# Patient Record
Sex: Female | Born: 1935 | Race: White | Hispanic: No | Smoking: Never smoker
Health system: Southern US, Community
[De-identification: ages and names within clinical notes are randomized; demographics above are authoritative.]

## PROBLEM LIST (undated history)

## (undated) DIAGNOSIS — I420 Dilated cardiomyopathy: Secondary | ICD-10-CM

## (undated) DIAGNOSIS — F0390 Unspecified dementia without behavioral disturbance: Secondary | ICD-10-CM

## (undated) DIAGNOSIS — I639 Cerebral infarction, unspecified: Secondary | ICD-10-CM

## (undated) DIAGNOSIS — M1990 Unspecified osteoarthritis, unspecified site: Secondary | ICD-10-CM

## (undated) DIAGNOSIS — I5022 Chronic systolic (congestive) heart failure: Principal | ICD-10-CM

## (undated) HISTORY — DX: Chronic systolic (congestive) heart failure: I50.22

## (undated) HISTORY — DX: Dilated cardiomyopathy: I42.0

---

## 1998-12-12 ENCOUNTER — Other Ambulatory Visit: Admission: RE | Admit: 1998-12-12 | Discharge: 1998-12-12 | Payer: Self-pay | Admitting: Obstetrics & Gynecology

## 2000-12-10 ENCOUNTER — Encounter (INDEPENDENT_AMBULATORY_CARE_PROVIDER_SITE_OTHER): Payer: Self-pay

## 2000-12-10 ENCOUNTER — Other Ambulatory Visit: Admission: RE | Admit: 2000-12-10 | Discharge: 2000-12-10 | Payer: Self-pay | Admitting: Obstetrics and Gynecology

## 2003-06-28 ENCOUNTER — Encounter: Admission: RE | Admit: 2003-06-28 | Discharge: 2003-06-28 | Payer: Self-pay | Admitting: Family Medicine

## 2003-08-22 ENCOUNTER — Emergency Department (HOSPITAL_COMMUNITY): Admission: EM | Admit: 2003-08-22 | Discharge: 2003-08-22 | Payer: Self-pay | Admitting: Emergency Medicine

## 2006-10-06 ENCOUNTER — Encounter (INDEPENDENT_AMBULATORY_CARE_PROVIDER_SITE_OTHER): Payer: Self-pay | Admitting: Specialist

## 2006-10-06 ENCOUNTER — Ambulatory Visit (HOSPITAL_COMMUNITY): Admission: RE | Admit: 2006-10-06 | Discharge: 2006-10-06 | Payer: Self-pay | Admitting: Gastroenterology

## 2008-03-19 ENCOUNTER — Emergency Department (HOSPITAL_COMMUNITY): Admission: EM | Admit: 2008-03-19 | Discharge: 2008-03-19 | Payer: Self-pay | Admitting: Emergency Medicine

## 2008-09-16 ENCOUNTER — Emergency Department (HOSPITAL_COMMUNITY): Admission: EM | Admit: 2008-09-16 | Discharge: 2008-09-17 | Payer: Self-pay | Admitting: Emergency Medicine

## 2010-08-30 ENCOUNTER — Encounter: Payer: Self-pay | Admitting: Family Medicine

## 2010-12-26 NOTE — Op Note (Signed)
NAME:  Christine [name], Christine [name] NO.:  [id number]   MEDICAL RECORD NO.:  [id number]          PATIENT TYPE:  AMB   LOCATION:  ENDO                         FACILITY:  [facility]   PHYSICIAN:  Anselmo Rod, M.D.  DATE OF BIRTH:  July 16, 1922   DATE OF PROCEDURE:  10/06/2006  DATE OF DISCHARGE:                               OPERATIVE REPORT   PROCEDURE PERFORMED:  Colonoscopy with snare polypectomy x1 and cold  biopsies times one.   ENDOSCOPIST:  Anselmo Rod, M.D.   INSTRUMENT USED:  Pentax video colonoscope.   INDICATIONS FOR PROCEDURE:  An 77 year old white female undergoing  screening colonoscopy.  The patient has a history of constipation and  use of laxatives on regular basis.  Rule out colonic polyps, masses,  etc.   PREPROCEDURE PREPARATION:  Informed consent was procured from the  patient.  The patient fasted for 8 hours prior to procedure and prepped  with a gallon of Nulytely the night prior to the procedure.  The risks  and benefits of the procedure including a 10% miss rate of cancer and  polyp were discussed with the patient as well.   PREPROCEDURE PHYSICAL:  The patient had stable vital signs.  Neck  supple.  Chest clear to auscultation.  S1, S2 regular.  Abdomen soft  with normal bowel sounds.   DESCRIPTION OF PROCEDURE:  The patient was placed in the left lateral  decubitus position and sedated with 75 mcg of fentanyl and 7 mg of  Versed given intravenously in slow incremental doses.  Once the patient  was adequately sedated and maintained on low-flow oxygen and continuous  cardiac monitoring, the Pentax video colonoscope was advanced from the  rectum to cecum with difficulty.  The patient has a large amount of  residual stool in the colon.  Multiple washes were done.  The  appendiceal orifice and ileocecal valve were visualized and  photographed.  A small sessile polyp was removed by hot snare from the  cecum (one) at lower settings of 150/15).   Another small sessile polyp  was biopsied from the cecum (cold biopsies x1).  There were a few  scattered diverticula noted especially in the sigmoid colon.  Small  internal hemorrhoids were seen on retroflexion.  The rest of the exam  was unremarkable.  Small lesions could be missed.  The patient tolerated  the procedure well without immediate complications.  There was evidence  of melanosis coli throughout the colonic mucosa.   IMPRESSION:  1. Small nonbleeding internal hemorrhoids.  2. 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  3. Few scattered diverticula, especially in the left colon.  4. Flat polyp snared from the cecum with another polyp biopsied from      the cecum.  5. Large amount of residual stool in the colon.  Small lesions could      be missed.   RECOMMENDATIONS:  1. Await pathology results.  2. Avoid all nonsteroidals for aspirin for the next two weeks.  3. Repeat colonoscopy depending on pathology results.  4. Outpatient follow-up in the next two weeks for further  recommendations.      Anselmo Rod, M.D.  Electronically Signed     JNM/MEDQ  D:  10/06/2006  T:  10/06/2006  Job:  [id number]   cc:   Gloriajean Dell. Andrey Campanile, M.D.

## 2011-02-25 ENCOUNTER — Inpatient Hospital Stay (HOSPITAL_COMMUNITY)
Admission: EM | Admit: 2011-02-25 | Discharge: 2011-02-27 | DRG: 690 | Disposition: A | Discharge status: Home / Self Care | Payer: Medicare Other | Attending: Family Medicine | Admitting: Family Medicine

## 2011-02-25 ENCOUNTER — Emergency Department (HOSPITAL_COMMUNITY): Payer: Medicare Other

## 2011-02-25 DIAGNOSIS — A498 Other bacterial infections of unspecified site: Secondary | ICD-10-CM | POA: Diagnosis present

## 2011-02-25 DIAGNOSIS — R5381 Other malaise: Secondary | ICD-10-CM | POA: Diagnosis present

## 2011-02-25 DIAGNOSIS — E860 Dehydration: Secondary | ICD-10-CM | POA: Diagnosis present

## 2011-02-25 DIAGNOSIS — I10 Essential (primary) hypertension: Secondary | ICD-10-CM | POA: Diagnosis present

## 2011-02-25 DIAGNOSIS — R509 Fever, unspecified: Secondary | ICD-10-CM | POA: Diagnosis present

## 2011-02-25 DIAGNOSIS — E119 Type 2 diabetes mellitus without complications: Secondary | ICD-10-CM | POA: Diagnosis present

## 2011-02-25 DIAGNOSIS — N390 Urinary tract infection, site not specified: Principal | ICD-10-CM | POA: Diagnosis present

## 2011-02-25 LAB — DIFFERENTIAL
Band Neutrophils: 3 % (ref 0–10)
Basophils Absolute: 0 10*3/uL (ref 0.0–0.1)
Basophils Relative: 0 % (ref 0–1)
Blasts: 0 %
Eosinophils Absolute: 0 10*3/uL (ref 0.0–0.7)
Eosinophils Relative: 0 % (ref 0–5)
Lymphocytes Relative: 6 % — ABNORMAL LOW (ref 12–46)
Lymphs Abs: 0.8 10*3/uL (ref 0.7–4.0)
Metamyelocytes Relative: 3 %
Monocytes Absolute: 0.8 10*3/uL (ref 0.1–1.0)
Monocytes Relative: 6 % (ref 3–12)
Myelocytes: 0 %
Neutro Abs: 12.4 10*3/uL — ABNORMAL HIGH (ref 1.7–7.7)
Neutrophils Relative %: 82 % — ABNORMAL HIGH (ref 43–77)
Promyelocytes Absolute: 0 %
nRBC: 0 /100 WBC

## 2011-02-25 LAB — CBC
HCT: 38.7 % (ref 36.0–46.0)
Hemoglobin: 12.9 g/dL (ref 12.0–15.0)
MCH: 29.9 pg (ref 26.0–34.0)
MCHC: 33.3 g/dL (ref 30.0–36.0)
MCV: 89.6 fL (ref 78.0–100.0)
Platelets: 317 10*3/uL (ref 150–400)
RBC: 4.32 MIL/uL (ref 3.87–5.11)
RDW: 13.5 % (ref 11.5–15.5)
WBC: 14 10*3/uL — ABNORMAL HIGH (ref 4.0–10.5)

## 2011-02-25 LAB — COMPREHENSIVE METABOLIC PANEL
ALT: 14 U/L (ref 0–35)
AST: 20 U/L (ref 0–37)
Albumin: 3.3 g/dL — ABNORMAL LOW (ref 3.5–5.2)
Alkaline Phosphatase: 72 U/L (ref 39–117)
BUN: 18 mg/dL (ref 6–23)
CO2: 29 mEq/L (ref 19–32)
Calcium: 9.2 mg/dL (ref 8.4–10.5)
Chloride: 98 mEq/L (ref 96–112)
Creatinine, Ser: 0.99 mg/dL (ref 0.50–1.10)
GFR calc Af Amer: >60 mL/min (ref >60)
GFR calc non Af Amer: 53 mL/min — ABNORMAL LOW (ref >60)
Glucose, Bld: 300 mg/dL — ABNORMAL HIGH (ref 70–99)
Potassium: 4.2 mEq/L (ref 3.5–5.1)
Sodium: 136 mEq/L (ref 135–145)
Total Bilirubin: 0.8 mg/dL (ref 0.3–1.2)
Total Protein: 7.2 g/dL (ref 6.0–8.3)

## 2011-02-25 LAB — URINALYSIS, ROUTINE W REFLEX MICROSCOPIC
Bilirubin Urine: NEGATIVE
Glucose, UA: >1000 mg/dL — AB
Ketones, ur: NEGATIVE mg/dL
Nitrite: POSITIVE — AB
Protein, ur: 30 mg/dL — AB
Specific Gravity, Urine: 1.013 (ref 1.005–1.030)
Urobilinogen, UA: 0.2 mg/dL (ref 0.0–1.0)
pH: 7 (ref 5.0–8.0)

## 2011-02-25 LAB — POCT I-STAT, CHEM 8
BUN: 18 mg/dL (ref 6–23)
Calcium, Ion: 1.09 mmol/L — ABNORMAL LOW (ref 1.12–1.32)
Chloride: 100 mEq/L (ref 96–112)
Creatinine, Ser: 1.1 mg/dL (ref 0.50–1.10)
Glucose, Bld: 304 mg/dL — ABNORMAL HIGH (ref 70–99)
HCT: 40 % (ref 36.0–46.0)
Hemoglobin: 13.6 g/dL (ref 12.0–15.0)
Potassium: 4.4 mEq/L (ref 3.5–5.1)
Sodium: 137 mEq/L (ref 135–145)
TCO2: 26 mmol/L (ref 0–100)

## 2011-02-25 LAB — URINE MICROSCOPIC-ADD ON

## 2011-02-25 LAB — GLUCOSE, CAPILLARY: Glucose-Capillary: 203 mg/dL — ABNORMAL HIGH (ref 70–99)

## 2011-02-25 LAB — LIPASE, BLOOD: Lipase: 12 U/L (ref 11–59)

## 2011-02-25 LAB — APTT: aPTT: 33 seconds (ref 24–37)

## 2011-02-25 LAB — PROTIME-INR
INR: 1.22 (ref 0.00–1.49)
Prothrombin Time: 15.7 seconds — ABNORMAL HIGH (ref 11.6–15.2)

## 2011-02-25 LAB — TROPONIN I: Troponin I: <0.3 ng/mL (ref <0.30)

## 2011-02-26 LAB — CBC
HCT: 36 % (ref 36.0–46.0)
Hemoglobin: 12.1 g/dL (ref 12.0–15.0)
MCH: 30.2 pg (ref 26.0–34.0)
MCHC: 33.6 g/dL (ref 30.0–36.0)
MCV: 89.8 fL (ref 78.0–100.0)
Platelets: 279 10*3/uL (ref 150–400)
RBC: 4.01 MIL/uL (ref 3.87–5.11)
RDW: 13.6 % (ref 11.5–15.5)
WBC: 12.8 10*3/uL — ABNORMAL HIGH (ref 4.0–10.5)

## 2011-02-26 LAB — DIFFERENTIAL
Basophils Absolute: 0 10*3/uL (ref 0.0–0.1)
Basophils Relative: 0 % (ref 0–1)
Eosinophils Absolute: 0 10*3/uL (ref 0.0–0.7)
Eosinophils Relative: 0 % (ref 0–5)
Lymphocytes Relative: 12 % (ref 12–46)
Lymphs Abs: 1.6 10*3/uL (ref 0.7–4.0)
Monocytes Absolute: 1.3 10*3/uL — ABNORMAL HIGH (ref 0.1–1.0)
Monocytes Relative: 11 % (ref 3–12)
Neutro Abs: 9.8 10*3/uL — ABNORMAL HIGH (ref 1.7–7.7)
Neutrophils Relative %: 77 % (ref 43–77)

## 2011-02-26 LAB — GLUCOSE, CAPILLARY: Glucose-Capillary: 217 mg/dL — ABNORMAL HIGH (ref 70–99)

## 2011-02-26 LAB — HEMOGLOBIN A1C
Hgb A1c MFr Bld: 8.6 % — ABNORMAL HIGH (ref <5.7)
Mean Plasma Glucose: 200 mg/dL — ABNORMAL HIGH (ref <117)

## 2011-02-27 LAB — URINE CULTURE
Colony Count: >=100000
Culture  Setup Time: 201207190151
Special Requests: POSITIVE

## 2011-02-27 LAB — BASIC METABOLIC PANEL
BUN: 16 mg/dL (ref 6–23)
Creatinine, Ser: 1.15 mg/dL — ABNORMAL HIGH (ref 0.50–1.10)
GFR calc non Af Amer: 45 mL/min — ABNORMAL LOW (ref >60)
Glucose, Bld: 198 mg/dL — ABNORMAL HIGH (ref 70–99)
Potassium: 3.6 mEq/L (ref 3.5–5.1)

## 2011-02-27 LAB — CBC
HCT: 36.8 % (ref 36.0–46.0)
Hemoglobin: 12.6 g/dL (ref 12.0–15.0)
MCH: 30.4 pg (ref 26.0–34.0)
MCHC: 34.2 g/dL (ref 30.0–36.0)

## 2011-02-27 LAB — GLUCOSE, CAPILLARY
Glucose-Capillary: 233 mg/dL — ABNORMAL HIGH (ref 70–99)
Glucose-Capillary: 165 mg/dL — ABNORMAL HIGH (ref 70–99)

## 2011-03-04 LAB — CULTURE, BLOOD (ROUTINE X 2): Culture  Setup Time: 201207190950

## 2011-03-05 NOTE — Discharge Summary (Signed)
NAMEMarland Kitchen  Christine [name], Christine [name] NO.:  [id number]  MEDICAL RECORD NO.:  [id number]  LOCATION:  1344                         FACILITY:  [facility]  PHYSICIAN:  Erick Blinks, MD     DATE OF BIRTH:  [birth year]  DATE OF ADMISSION:  02/25/2011 DATE OF DISCHARGE:  02/27/2011                              DISCHARGE SUMMARY   PRIMARY CARE PHYSICIAN:  Gloriajean Dell. Andrey Campanile, M.D.  DISCHARGE DIAGNOSES: 1. Urinary tract infection, E-coli. 2. Fever secondary to #1. 3. Hypertension. 4. Type 2 diabetes. 5. Dehydration, resolved.  DISCHARGE MEDICATIONS: 1. Centrum Silver multivitamin 1 tablet p.o. daily. 2. Vitamin B12 over-the-counter 1 capsule p.o. daily. 3. Zinc over-the-counter 1 tablet p.o. daily. 4. Vitamin D3 over-the-counter 1 tablet p.o. daily. 5. Augmentin 875 mg p.o. b.i.d. 6. Metoprolol 25 mg 1 tablet p.o. b.i.d. 7. Amaryl 1 mg 1 tablet p.o. daily.  ADMISSION HISTORY:  This is an 77 year old female who presents to the emergency room with weakness for 2 to 3 days.  The patient reported having hot and cold chills.  She had nausea and persistent emesis.  In the emergency room, she was found have leukocytosis, was febrile and had a positive urinalysis.  She was subsequently admitted for further treatment of her urinary tract infection.  For details, please refer the history and physical per Dr. Tobey Grim COURSE: 1. Urinary tract infection.  The patient was placed empirically on     Rocephin.  She also did receive a dose of IV ciprofloxacin from     which she developed hives.  Therefore she was continued on     Rocephin.  Since then she has begun to defervesce.  Her WBC count     has returned to a normal range.  She has been adequately rehydrated     and feels back to her normal self.  She is ambulating without     difficulty.  At this time, we will switch her to oral antibiotics.     Her urine cultures are positive for E-coli.  We will provide her     empiric coverage  with Augmentin.  She will need to follow up with     her primary care physician in next 1 week. 2. Hypertension.  The patient was noted to be persistently     hypertensive here in the hospital.  Her blood pressure has been     ranging between 150 to 170.  We will start empirically on     metoprolol and this can further be followed up by her primary care     physician. 3. Diabetes.  The patient was found to have an A1c of 8.6.  Her blood     sugar on initial evaluation in the emergency room was 300.  This     has since become stable.  She was placed on sliding scale insulin     here in the emergency room.  We will discharge her on low-dose     Amaryl as well as diet precautions.  She may follow up with her     primary care physician for further management.  DISCHARGE INSTRUCTIONS:  The patient should follow up with her  primary care physician in the next 1 week which she said she can arrange.  She will need to continue on a heart-healthy, low-calorie diet; conduct her activity as tolerated.  CONDITION AT TIME OF DISCHARGE:  Improved.     Erick Blinks, MD     JM/MEDQ  D:  02/27/2011  T:  02/27/2011  Job:  [id number]  cc:   Gloriajean Dell. Andrey Campanile, M.D. Fax: [phone]  Electronically Signed by Erick Blinks  on 03/05/2011 01:07:04 AM

## 2011-03-11 NOTE — H&P (Signed)
NAME:  Christine [name], Christine [name] NO.:  [id number]  MEDICAL RECORD NO.:  [id number]  LOCATION:  1344                         FACILITY:  [facility]  PHYSICIAN:  Altha Harm, MDDATE OF BIRTH:  1922-04-10  DATE OF ADMISSION:  02/25/2011 DATE OF DISCHARGE:                             HISTORY & PHYSICAL   PRIMARY CARE PHYSICIAN:  Benedetto Goad, MD  CHIEF COMPLAINT:  Weakness x 2 to 3 days.  HISTORY OF PRESENT ILLNESS:  Christine [name] is an 77 year old female, who looks so much younger than her stated age, who presents to the emergency room with weakness for 2 to 3 days.  The patient states that on today she had some hot flashes.  She had hot and cold chills, nausea and persistent emesis.  The patient says she is also having some frequency of urination over the last few days.  She denies any dizziness, any chest pain, any polyuria, any polydipsia or any polyphagia.  The patient states that the only thing she could tolerate were fruit cups.  So she had two fruit cups last night and two fruit cups this morning prior to coming to the emergency room.  PAST MEDICAL HISTORY:  The patient denies any past medical history, however, from previous records it shows a history of Melanosis coli from a colonoscopy performed by Dr. Loreta Ave in 2005 and a history of diverticulosis.  FAMILY HISTORY:  Significant for cancer in her father; the patient does not know what type of cancer.  And, CVAs in her mother.  SOCIAL HISTORY:  The patient lives alone but has good support of her family, her son and her granddaughter who is a Health and safety inspector.  She has no tobacco, alcohol or drug use, and the patient is independent at the community level.  ALLERGIES:  IBUPROFEN and ALEVE.  And the patient thinks some antibiotic that was given to for shingles, likely either VALTREX or ACYCLOVIR, which caused hives.  Presently she is on no chronic medications.  She is only on multiple vitamins at home.  REVIEW OF  SYSTEMS:  All other systems negative.  STUDIES IN THE EMERGENCY ROOM:  White blood cell count of 14, hemoglobin of 13.6, hematocrit of 40, platelet count of 317.  Sodium 136, potassium 4.2, chloride 98, bicarb 29, BUN 18, creatinine 0.99, blood sugar of 300.  PHYSICAL EXAMINATION:  VITAL SIGNS:  The patient is presently afebrile, vital signs are stable. GENERAL:  The patient is well-appearing, sitting up in bed, who looks much younger than her stated age. HEENT:  She is normocephalic, atraumatic.  Pupils equally round and reactive to light and accommodation.  Extraocular movements are intact. Oropharynx is moist.  No exudates, erythema or lesions are noted. NECK:  Trachea is midline.  No masses, no thyromegaly, no JVD, no carotid bruit. LUNGS:  Normal respiratory effort, equal excursion bilaterally.  No wheezing or rhonchi noted. CARDIOVASCULAR:  Normal S1 and S2.  No murmurs, rubs or gallops are noted.  PMI is nondisplaced.  No heaves or thrills on palpation. ABDOMEN:  Obese, soft, nontender, nondistended.  No masses.  No hepatosplenomegaly is noted. EXTREMITIES:  No clubbing, cyanosis or edema.  The patient has no CVA tenderness. NEUROLOGIC:  No focal neurological deficits.  Cranial nerves II-XII are grossly intact.  DTRs are 2+ bilaterally in the upper and lower extremities. PSYCHIATRIC:  She is alert and oriented x3.  Good insight and cognition. Good recent and remote recall.  ASSESSMENT AND PLAN:  This is a patient who presents with a urinary tract infection by findings in the urinalysis.  The patient will be given gentle IV fluids and we will also start her on IV antibiotics, ciprofloxacin.  Hyperglycemia:  The patient has eaten since then very high in sugar immediately prior to coming into the emergency room.  I will go ahead and get a hemoglobin A1c and check her blood sugars.  However, I will not start any insulin on this patient as it is highly unlikely that  the patient of 88 years will develop adult-onset diabetes at this age when she has had no history of diabetes ever in the past.  Otherwise, the patient is stable.  She is being admitted to team 3 and she will be placed on a telemetry bed.     Altha Harm, MD     MAM/MEDQ  D:  02/25/2011  T:  02/25/2011  Job:  [id number]  Electronically Signed by Marthann Schiller MD on 03/11/2011 03:31:31 PM

## 2011-05-04 ENCOUNTER — Other Ambulatory Visit (HOSPITAL_COMMUNITY): Payer: Self-pay | Admitting: Orthopedic Surgery

## 2011-05-04 DIAGNOSIS — T84039A Mechanical loosening of unspecified internal prosthetic joint, initial encounter: Secondary | ICD-10-CM

## 2011-05-13 ENCOUNTER — Other Ambulatory Visit (HOSPITAL_COMMUNITY): Payer: Medicare Other

## 2011-05-13 ENCOUNTER — Encounter (HOSPITAL_COMMUNITY): Payer: Medicare Other

## 2014-10-12 ENCOUNTER — Ambulatory Visit: Payer: Self-pay | Admitting: Podiatry

## 2016-03-11 ENCOUNTER — Other Ambulatory Visit: Payer: Self-pay | Admitting: Geriatric Medicine

## 2016-03-11 ENCOUNTER — Ambulatory Visit
Admission: RE | Admit: 2016-03-11 | Discharge: 2016-03-11 | Disposition: A | Discharge status: Home / Self Care | Payer: Medicare Other | Source: Ambulatory Visit | Attending: Geriatric Medicine | Admitting: Geriatric Medicine

## 2016-03-11 DIAGNOSIS — M25551 Pain in right hip: Secondary | ICD-10-CM

## 2016-03-28 ENCOUNTER — Inpatient Hospital Stay (HOSPITAL_COMMUNITY)
Admission: EM | Admit: 2016-03-28 | Discharge: 2016-03-30 | DRG: 470 | Disposition: A | Discharge status: Skilled Nursing Facility | Payer: Medicare Other | Attending: Internal Medicine | Admitting: Internal Medicine

## 2016-03-28 ENCOUNTER — Inpatient Hospital Stay (HOSPITAL_COMMUNITY): Payer: Medicare Other | Admitting: Anesthesiology

## 2016-03-28 ENCOUNTER — Inpatient Hospital Stay (HOSPITAL_COMMUNITY): Payer: Medicare Other

## 2016-03-28 ENCOUNTER — Emergency Department (HOSPITAL_COMMUNITY): Payer: Medicare Other

## 2016-03-28 ENCOUNTER — Encounter (HOSPITAL_COMMUNITY): Payer: Self-pay

## 2016-03-28 ENCOUNTER — Encounter (HOSPITAL_COMMUNITY)
Admission: EM | Disposition: A | Discharge status: Skilled Nursing Facility | Payer: Self-pay | Source: Home / Self Care | Attending: Internal Medicine

## 2016-03-28 DIAGNOSIS — E119 Type 2 diabetes mellitus without complications: Secondary | ICD-10-CM

## 2016-03-28 DIAGNOSIS — I10 Essential (primary) hypertension: Secondary | ICD-10-CM | POA: Diagnosis present

## 2016-03-28 DIAGNOSIS — I159 Secondary hypertension, unspecified: Secondary | ICD-10-CM | POA: Diagnosis present

## 2016-03-28 DIAGNOSIS — Z1611 Resistance to penicillins: Secondary | ICD-10-CM | POA: Diagnosis present

## 2016-03-28 DIAGNOSIS — D473 Essential (hemorrhagic) thrombocythemia: Secondary | ICD-10-CM

## 2016-03-28 DIAGNOSIS — E871 Hypo-osmolality and hyponatremia: Secondary | ICD-10-CM | POA: Diagnosis present

## 2016-03-28 DIAGNOSIS — Z8781 Personal history of (healed) traumatic fracture: Secondary | ICD-10-CM | POA: Diagnosis not present

## 2016-03-28 DIAGNOSIS — Z01818 Encounter for other preprocedural examination: Secondary | ICD-10-CM

## 2016-03-28 DIAGNOSIS — Y9210 Unspecified residential institution as the place of occurrence of the external cause: Secondary | ICD-10-CM | POA: Diagnosis not present

## 2016-03-28 DIAGNOSIS — D72829 Elevated white blood cell count, unspecified: Secondary | ICD-10-CM | POA: Diagnosis present

## 2016-03-28 DIAGNOSIS — N390 Urinary tract infection, site not specified: Secondary | ICD-10-CM | POA: Diagnosis present

## 2016-03-28 DIAGNOSIS — S72002A Fracture of unspecified part of neck of left femur, initial encounter for closed fracture: Principal | ICD-10-CM | POA: Diagnosis present

## 2016-03-28 DIAGNOSIS — E860 Dehydration: Secondary | ICD-10-CM | POA: Diagnosis present

## 2016-03-28 DIAGNOSIS — M1612 Unilateral primary osteoarthritis, left hip: Secondary | ICD-10-CM | POA: Diagnosis present

## 2016-03-28 DIAGNOSIS — W010XXA Fall on same level from slipping, tripping and stumbling without subsequent striking against object, initial encounter: Secondary | ICD-10-CM | POA: Diagnosis present

## 2016-03-28 DIAGNOSIS — E878 Other disorders of electrolyte and fluid balance, not elsewhere classified: Secondary | ICD-10-CM | POA: Diagnosis present

## 2016-03-28 DIAGNOSIS — B961 Klebsiella pneumoniae [K. pneumoniae] as the cause of diseases classified elsewhere: Secondary | ICD-10-CM | POA: Diagnosis present

## 2016-03-28 DIAGNOSIS — E1100 Type 2 diabetes mellitus with hyperosmolarity without nonketotic hyperglycemic-hyperosmolar coma (NKHHC): Secondary | ICD-10-CM

## 2016-03-28 DIAGNOSIS — D7589 Other specified diseases of blood and blood-forming organs: Secondary | ICD-10-CM | POA: Diagnosis present

## 2016-03-28 DIAGNOSIS — S72009A Fracture of unspecified part of neck of unspecified femur, initial encounter for closed fracture: Secondary | ICD-10-CM | POA: Diagnosis present

## 2016-03-28 DIAGNOSIS — M810 Age-related osteoporosis without current pathological fracture: Secondary | ICD-10-CM | POA: Diagnosis present

## 2016-03-28 DIAGNOSIS — M25552 Pain in left hip: Secondary | ICD-10-CM | POA: Diagnosis present

## 2016-03-28 DIAGNOSIS — D75839 Thrombocytosis, unspecified: Secondary | ICD-10-CM | POA: Diagnosis present

## 2016-03-28 DIAGNOSIS — N3000 Acute cystitis without hematuria: Secondary | ICD-10-CM | POA: Diagnosis not present

## 2016-03-28 DIAGNOSIS — E1165 Type 2 diabetes mellitus with hyperglycemia: Secondary | ICD-10-CM | POA: Diagnosis present

## 2016-03-28 DIAGNOSIS — Z419 Encounter for procedure for purposes other than remedying health state, unspecified: Secondary | ICD-10-CM

## 2016-03-28 HISTORY — DX: Unspecified osteoarthritis, unspecified site: M19.90

## 2016-03-28 HISTORY — PX: ANTERIOR APPROACH HEMI HIP ARTHROPLASTY: SHX6690

## 2016-03-28 LAB — CBC WITH DIFFERENTIAL/PLATELET
Basophils Absolute: 0 10*3/uL (ref 0.0–0.1)
Basophils Relative: 0 %
EOS ABS: 0 10*3/uL (ref 0.0–0.7)
EOS PCT: 0 %
HCT: 42.8 % (ref 36.0–46.0)
Hemoglobin: 15.3 g/dL — ABNORMAL HIGH (ref 12.0–15.0)
LYMPHS ABS: 1.4 10*3/uL (ref 0.7–4.0)
Lymphocytes Relative: 6 %
MCH: 30.1 pg (ref 26.0–34.0)
MCHC: 35.7 g/dL (ref 30.0–36.0)
MCV: 84.3 fL (ref 78.0–100.0)
MONO ABS: 1.2 10*3/uL — AB (ref 0.1–1.0)
MONOS PCT: 5 %
Neutro Abs: 20.4 10*3/uL — ABNORMAL HIGH (ref 1.7–7.7)
Neutrophils Relative %: 89 %
PLATELETS: 538 10*3/uL — AB (ref 150–400)
RBC: 5.08 MIL/uL (ref 3.87–5.11)
RDW: 13 % (ref 11.5–15.5)
WBC: 23.1 10*3/uL — AB (ref 4.0–10.5)

## 2016-03-28 LAB — TYPE AND SCREEN
ABO/RH(D): A POS
Antibody Screen: NEGATIVE

## 2016-03-28 LAB — TROPONIN I: Troponin I: <0.03 ng/mL (ref <0.03)

## 2016-03-28 LAB — PROTIME-INR
INR: 1.01
Prothrombin Time: 13.3 seconds (ref 11.4–15.2)

## 2016-03-28 LAB — GLUCOSE, CAPILLARY
Glucose-Capillary: 341 mg/dL — ABNORMAL HIGH (ref 65–99)
Glucose-Capillary: 276 mg/dL — ABNORMAL HIGH (ref 65–99)
Glucose-Capillary: 260 mg/dL — ABNORMAL HIGH (ref 65–99)
Glucose-Capillary: 190 mg/dL — ABNORMAL HIGH (ref 65–99)
Glucose-Capillary: 217 mg/dL — ABNORMAL HIGH (ref 65–99)
Glucose-Capillary: 275 mg/dL — ABNORMAL HIGH (ref 65–99)

## 2016-03-28 LAB — URINALYSIS, ROUTINE W REFLEX MICROSCOPIC
Bilirubin Urine: NEGATIVE
KETONES UR: NEGATIVE mg/dL
Nitrite: NEGATIVE
PH: 5.5 (ref 5.0–8.0)
Protein, ur: 30 mg/dL — AB
SPECIFIC GRAVITY, URINE: 1.024 (ref 1.005–1.030)

## 2016-03-28 LAB — SURGICAL PCR SCREEN
MRSA, PCR: NEGATIVE
STAPHYLOCOCCUS AUREUS: NEGATIVE

## 2016-03-28 LAB — BASIC METABOLIC PANEL
Anion gap: 10 (ref 5–15)
BUN: 30 mg/dL — AB (ref 6–20)
CO2: 24 mmol/L (ref 22–32)
CREATININE: 0.84 mg/dL (ref 0.44–1.00)
Calcium: 9.4 mg/dL (ref 8.9–10.3)
Chloride: 100 mmol/L — ABNORMAL LOW (ref 101–111)
GFR calc Af Amer: >60 mL/min (ref >60)
GFR, EST NON AFRICAN AMERICAN: 59 mL/min — AB (ref >60)
GLUCOSE: 364 mg/dL — AB (ref 65–99)
Potassium: 3.8 mmol/L (ref 3.5–5.1)
SODIUM: 134 mmol/L — AB (ref 135–145)

## 2016-03-28 LAB — URINE MICROSCOPIC-ADD ON
RBC / HPF: NONE SEEN RBC/hpf (ref 0–5)
Squamous Epithelial / LPF: NONE SEEN

## 2016-03-28 LAB — ABO/RH: ABO/RH(D): A POS

## 2016-03-28 SURGERY — HEMIARTHROPLASTY, HIP, DIRECT ANTERIOR APPROACH, FOR FRACTURE
Anesthesia: Monitor Anesthesia Care | Site: Hip | Laterality: Left

## 2016-03-28 MED ORDER — HYDROCODONE-ACETAMINOPHEN 5-325 MG PO TABS: 1.0000 | ORAL_TABLET | Freq: Four times a day (QID) | ORAL | 0 refills | Status: DC | PRN

## 2016-03-28 MED ORDER — PHENYLEPHRINE HCL 10 MG/ML IJ SOLN
INTRAMUSCULAR | Status: AC
  Filled 2016-03-28: qty 1

## 2016-03-28 MED ORDER — LACTATED RINGERS IV SOLN
INTRAVENOUS | Status: DC | PRN
  Administered 2016-03-28: 14:00:00 via INTRAVENOUS

## 2016-03-28 MED ORDER — FENTANYL CITRATE (PF) 100 MCG/2ML IJ SOLN
50.0000 ug | Freq: Once | INTRAMUSCULAR | Status: DC
Start: 2016-03-28 — End: 2016-03-28

## 2016-03-28 MED ORDER — SODIUM CHLORIDE 0.9 % IV SOLN
INTRAVENOUS | Status: DC
  Administered 2016-03-28 – 2016-03-29 (×2): via INTRAVENOUS

## 2016-03-28 MED ORDER — BUPIVACAINE IN DEXTROSE 0.75-8.25 % IT SOLN
INTRATHECAL | Status: DC | PRN
  Administered 2016-03-28: 1.8 mL via INTRATHECAL

## 2016-03-28 MED ORDER — BISACODYL 5 MG PO TBEC: 5.0000 mg | DELAYED_RELEASE_TABLET | Freq: Every day | ORAL | Status: DC | PRN

## 2016-03-28 MED ORDER — CHLORHEXIDINE GLUCONATE 4 % EX LIQD: 60.0000 mL | Freq: Once | CUTANEOUS | Status: DC

## 2016-03-28 MED ORDER — BUPIVACAINE HCL (PF) 0.25 % IJ SOLN
INTRAMUSCULAR | Status: AC
Start: 2016-03-28 — End: 2016-03-28
  Filled 2016-03-28: qty 30

## 2016-03-28 MED ORDER — POVIDONE-IODINE 10 % EX SWAB: 2.0000 "application " | Freq: Once | CUTANEOUS | Status: DC

## 2016-03-28 MED ORDER — SENNA 8.6 MG PO TABS
1.0000 | ORAL_TABLET | Freq: Two times a day (BID) | ORAL | Status: DC
  Administered 2016-03-28 – 2016-03-30 (×4): 8.6 mg via ORAL
  Filled 2016-03-28 (×4): qty 1

## 2016-03-28 MED ORDER — STERILE WATER FOR IRRIGATION IR SOLN
Status: DC | PRN
  Administered 2016-03-28: 2000 mL

## 2016-03-28 MED ORDER — CEFAZOLIN SODIUM-DEXTROSE 2-4 GM/100ML-% IV SOLN
2.0000 g | Freq: Four times a day (QID) | INTRAVENOUS | Status: AC
  Administered 2016-03-28 – 2016-03-29 (×2): 2 g via INTRAVENOUS
  Filled 2016-03-28 (×2): qty 100

## 2016-03-28 MED ORDER — OXYCODONE HCL 5 MG/5ML PO SOLN
5.0000 mg | Freq: Once | ORAL | Status: DC | PRN
  Filled 2016-03-28: qty 5

## 2016-03-28 MED ORDER — FENTANYL CITRATE (PF) 100 MCG/2ML IJ SOLN
INTRAMUSCULAR | Status: DC | PRN
  Administered 2016-03-28: 25 ug via INTRAVENOUS

## 2016-03-28 MED ORDER — DEXTROSE 5 % IV SOLN
1.0000 g | Freq: Once | INTRAVENOUS | Status: AC
  Administered 2016-03-28: 1 g via INTRAVENOUS
  Filled 2016-03-28: qty 10

## 2016-03-28 MED ORDER — POLYETHYLENE GLYCOL 3350 17 G PO PACK: 17.0000 g | PACK | Freq: Every day | ORAL | Status: DC | PRN

## 2016-03-28 MED ORDER — DOCUSATE SODIUM 100 MG PO CAPS
100.0000 mg | ORAL_CAPSULE | Freq: Two times a day (BID) | ORAL | Status: DC
  Administered 2016-03-28 – 2016-03-30 (×4): 100 mg via ORAL
  Filled 2016-03-28 (×4): qty 1

## 2016-03-28 MED ORDER — METHOCARBAMOL 1000 MG/10ML IJ SOLN
500.0000 mg | Freq: Four times a day (QID) | INTRAVENOUS | Status: DC | PRN
  Filled 2016-03-28: qty 5

## 2016-03-28 MED ORDER — METHOCARBAMOL 500 MG PO TABS
500.0000 mg | ORAL_TABLET | Freq: Four times a day (QID) | ORAL | Status: DC | PRN
  Administered 2016-03-29: 500 mg via ORAL
  Filled 2016-03-28: qty 1

## 2016-03-28 MED ORDER — PROPOFOL 10 MG/ML IV BOLUS
INTRAVENOUS | Status: DC | PRN
  Administered 2016-03-28: 30 mg via INTRAVENOUS

## 2016-03-28 MED ORDER — KETAMINE HCL 10 MG/ML IJ SOLN
INTRAMUSCULAR | Status: AC
  Filled 2016-03-28: qty 1

## 2016-03-28 MED ORDER — HYDRALAZINE HCL 20 MG/ML IJ SOLN
10.0000 mg | INTRAMUSCULAR | Status: DC | PRN
  Administered 2016-03-28: 10 mg via INTRAVENOUS
  Filled 2016-03-28: qty 1

## 2016-03-28 MED ORDER — HYDROMORPHONE HCL 1 MG/ML IJ SOLN
0.5000 mg | INTRAMUSCULAR | Status: DC | PRN
  Administered 2016-03-28 (×2): 0.5 mg via INTRAVENOUS
  Filled 2016-03-28 (×2): qty 1

## 2016-03-28 MED ORDER — ACETAMINOPHEN 650 MG RE SUPP: 650.0000 mg | Freq: Four times a day (QID) | RECTAL | Status: DC | PRN

## 2016-03-28 MED ORDER — INSULIN ASPART 100 UNIT/ML ~~LOC~~ SOLN
0.0000 [IU] | SUBCUTANEOUS | Status: DC
  Administered 2016-03-28: 7 [IU] via SUBCUTANEOUS
  Administered 2016-03-28: 5 [IU] via SUBCUTANEOUS

## 2016-03-28 MED ORDER — CEFAZOLIN SODIUM-DEXTROSE 2-3 GM-% IV SOLR
INTRAVENOUS | Status: DC | PRN
  Administered 2016-03-28: 2 g via INTRAVENOUS

## 2016-03-28 MED ORDER — ACETAMINOPHEN 325 MG PO TABS: 650.0000 mg | ORAL_TABLET | Freq: Four times a day (QID) | ORAL | Status: DC | PRN

## 2016-03-28 MED ORDER — SODIUM CHLORIDE 0.9 % IV SOLN
INTRAVENOUS | Status: DC
  Administered 2016-03-28: 06:00:00 via INTRAVENOUS

## 2016-03-28 MED ORDER — ASPIRIN EC 325 MG PO TBEC: 325.0000 mg | DELAYED_RELEASE_TABLET | Freq: Two times a day (BID) | ORAL | 0 refills | Status: DC

## 2016-03-28 MED ORDER — FENTANYL CITRATE (PF) 100 MCG/2ML IJ SOLN: 25.0000 ug | INTRAMUSCULAR | Status: DC | PRN

## 2016-03-28 MED ORDER — FENTANYL CITRATE (PF) 100 MCG/2ML IJ SOLN
INTRAMUSCULAR | Status: AC
  Filled 2016-03-28: qty 2

## 2016-03-28 MED ORDER — ONDANSETRON HCL 4 MG/2ML IJ SOLN: 4.0000 mg | Freq: Four times a day (QID) | INTRAMUSCULAR | Status: DC | PRN

## 2016-03-28 MED ORDER — HYDROCODONE-ACETAMINOPHEN 5-325 MG PO TABS
1.0000 | ORAL_TABLET | Freq: Four times a day (QID) | ORAL | Status: DC | PRN
  Administered 2016-03-28 – 2016-03-30 (×3): 1 via ORAL
  Filled 2016-03-28 (×3): qty 1

## 2016-03-28 MED ORDER — ASPIRIN EC 325 MG PO TBEC
325.0000 mg | DELAYED_RELEASE_TABLET | Freq: Two times a day (BID) | ORAL | Status: DC
  Administered 2016-03-29 – 2016-03-30 (×3): 325 mg via ORAL
  Filled 2016-03-28 (×3): qty 1

## 2016-03-28 MED ORDER — KETAMINE HCL 50 MG/ML IJ SOLN
INTRAMUSCULAR | Status: DC | PRN
  Administered 2016-03-28: 25 mg via INTRAMUSCULAR

## 2016-03-28 MED ORDER — CEFAZOLIN SODIUM-DEXTROSE 2-4 GM/100ML-% IV SOLN: 2.0000 g | INTRAVENOUS | Status: DC

## 2016-03-28 MED ORDER — ONDANSETRON HCL 4 MG/2ML IJ SOLN
INTRAMUSCULAR | Status: AC
  Filled 2016-03-28: qty 2

## 2016-03-28 MED ORDER — PROPOFOL 500 MG/50ML IV EMUL
INTRAVENOUS | Status: DC | PRN
  Administered 2016-03-28: 10 ug/kg/min via INTRAVENOUS

## 2016-03-28 MED ORDER — FENTANYL CITRATE (PF) 100 MCG/2ML IJ SOLN
25.0000 ug | INTRAMUSCULAR | Status: DC | PRN
  Filled 2016-03-28: qty 2

## 2016-03-28 MED ORDER — CEFAZOLIN SODIUM-DEXTROSE 2-4 GM/100ML-% IV SOLN
INTRAVENOUS | Status: AC
  Filled 2016-03-28: qty 100

## 2016-03-28 MED ORDER — SODIUM CHLORIDE 0.9 % IR SOLN
Status: DC | PRN
  Administered 2016-03-28: 1000 mL

## 2016-03-28 MED ORDER — BUPIVACAINE HCL (PF) 0.25 % IJ SOLN
INTRAMUSCULAR | Status: DC | PRN
  Administered 2016-03-28: 30 mL

## 2016-03-28 MED ORDER — CEFTRIAXONE SODIUM 1 G IJ SOLR
1.0000 g | Freq: Every day | INTRAMUSCULAR | Status: DC
  Filled 2016-03-28: qty 10

## 2016-03-28 MED ORDER — ONDANSETRON HCL 4 MG/2ML IJ SOLN
INTRAMUSCULAR | Status: DC | PRN
  Administered 2016-03-28: 4 mg via INTRAVENOUS

## 2016-03-28 MED ORDER — ONDANSETRON HCL 4 MG PO TABS: 4.0000 mg | ORAL_TABLET | Freq: Four times a day (QID) | ORAL | Status: DC | PRN

## 2016-03-28 MED ORDER — OXYCODONE HCL 5 MG PO TABS: 5.0000 mg | ORAL_TABLET | Freq: Once | ORAL | Status: DC | PRN

## 2016-03-28 MED ORDER — HYDROMORPHONE HCL 1 MG/ML IJ SOLN: 0.1000 mg | INTRAMUSCULAR | Status: DC | PRN

## 2016-03-28 MED ORDER — 0.9 % SODIUM CHLORIDE (POUR BTL) OPTIME
TOPICAL | Status: DC | PRN
  Administered 2016-03-28: 1000 mL

## 2016-03-28 MED ORDER — INSULIN ASPART 100 UNIT/ML ~~LOC~~ SOLN
0.0000 [IU] | Freq: Three times a day (TID) | SUBCUTANEOUS | Status: DC
  Administered 2016-03-29: 3 [IU] via SUBCUTANEOUS
  Administered 2016-03-29: 7 [IU] via SUBCUTANEOUS

## 2016-03-28 MED ORDER — ALUM & MAG HYDROXIDE-SIMETH 200-200-20 MG/5ML PO SUSP: 30.0000 mL | ORAL | Status: DC | PRN

## 2016-03-28 MED ORDER — ONDANSETRON HCL 4 MG/2ML IJ SOLN
4.0000 mg | Freq: Once | INTRAMUSCULAR | Status: DC
  Filled 2016-03-28: qty 2

## 2016-03-28 MED ORDER — PROPOFOL 10 MG/ML IV BOLUS
INTRAVENOUS | Status: AC
  Filled 2016-03-28: qty 20

## 2016-03-28 SURGICAL SUPPLY — 52 items
APL SKNCLS STERI-STRIP NONHPOA (GAUZE/BANDAGES/DRESSINGS) ×1
BAG SPEC THK2 15X12 ZIP CLS (MISCELLANEOUS) ×1
BAG ZIPLOCK 12X15 (MISCELLANEOUS) ×3 IMPLANT
BENZOIN TINCTURE PRP APPL 2/3 (GAUZE/BANDAGES/DRESSINGS) ×2 IMPLANT
BIT DRILL 2.4X128 (BIT) ×2 IMPLANT
BIT DRILL 2.4X128MM (BIT) ×1
BLADE SAW SAG 73X25 THK (BLADE) ×2
BLADE SAW SGTL 73X25 THK (BLADE) ×1 IMPLANT
BRUSH FEMORAL CANAL (MISCELLANEOUS) IMPLANT
CLOSURE STERI-STRIP 1/4X4 (GAUZE/BANDAGES/DRESSINGS) ×2 IMPLANT
COVER SURGICAL LIGHT HANDLE (MISCELLANEOUS) ×3 IMPLANT
DRAPE INCISE IOBAN 66X45 STRL (DRAPES) ×3 IMPLANT
DRAPE ORTHO SPLIT 77X108 STRL (DRAPES) ×6
DRAPE POUCH INSTRU U-SHP 10X18 (DRAPES) ×3 IMPLANT
DRAPE SHEET LG 3/4 BI-LAMINATE (DRAPES) ×3 IMPLANT
DRAPE SURG ORHT 6 SPLT 77X108 (DRAPES) ×2 IMPLANT
DRSG AQUACEL AG ADV 3.5X10 (GAUZE/BANDAGES/DRESSINGS) ×2 IMPLANT
DRSG MEPILEX BORDER 4X8 (GAUZE/BANDAGES/DRESSINGS) ×3 IMPLANT
DURAPREP 26ML APPLICATOR (WOUND CARE) ×3 IMPLANT
ELECT BLADE TIP CTD 4 INCH (ELECTRODE) ×3 IMPLANT
ELECT REM PT RETURN 9FT ADLT (ELECTROSURGICAL) ×3
ELECTRODE REM PT RTRN 9FT ADLT (ELECTROSURGICAL) ×1 IMPLANT
EVACUATOR 1/8 PVC DRAIN (DRAIN) IMPLANT
GLOVE BIO SURGEON STRL SZ8 (GLOVE) ×6 IMPLANT
GLOVE ORTHO TXT STRL SZ7.5 (GLOVE) ×6 IMPLANT
HANDPIECE INTERPULSE COAX TIP (DISPOSABLE)
HOOD PEEL AWAY FLYTE STAYCOOL (MISCELLANEOUS) ×6 IMPLANT
IMMOBILIZER KNEE 20 (SOFTGOODS)
IMMOBILIZER KNEE 20 THIGH 36 (SOFTGOODS) IMPLANT
MANIFOLD NEPTUNE II (INSTRUMENTS) ×3 IMPLANT
NEEDLE HYPO 22GX1.5 SAFETY (NEEDLE) ×3 IMPLANT
NS IRRIG 1000ML POUR BTL (IV SOLUTION) ×3 IMPLANT
PACK TOTAL JOINT (CUSTOM PROCEDURE TRAY) ×3 IMPLANT
PASSER SUT SWANSON 36MM LOOP (INSTRUMENTS) ×3 IMPLANT
POSITIONER SURGICAL ARM (MISCELLANEOUS) ×3 IMPLANT
PRESSURIZER FEMORAL UNIV (MISCELLANEOUS) IMPLANT
SET HNDPC FAN SPRY TIP SCT (DISPOSABLE) IMPLANT
STAPLER VISISTAT 35W (STAPLE) ×3 IMPLANT
SUCTION FRAZIER HANDLE 10FR (MISCELLANEOUS) ×2
SUCTION TUBE FRAZIER 10FR DISP (MISCELLANEOUS) ×1 IMPLANT
SUT ETHIBOND NAB CT1 #1 30IN (SUTURE) ×12 IMPLANT
SUT VIC AB 0 CTX 27 (SUTURE) ×6 IMPLANT
SUT VIC AB 1 CTX 36 (SUTURE) ×12
SUT VIC AB 1 CTX36XBRD ANBCTR (SUTURE) ×4 IMPLANT
SUT VIC AB 2-0 CT1 27 (SUTURE) ×9
SUT VIC AB 2-0 CT1 TAPERPNT 27 (SUTURE) ×3 IMPLANT
SYR CONTROL 10ML LL (SYRINGE) ×3 IMPLANT
TOWEL OR 17X26 10 PK STRL BLUE (TOWEL DISPOSABLE) ×3 IMPLANT
TOWER CARTRIDGE SMART MIX (DISPOSABLE) IMPLANT
TRAY FOLEY W/METER SILVER 14FR (SET/KITS/TRAYS/PACK) IMPLANT
TRAY FOLEY W/METER SILVER 16FR (SET/KITS/TRAYS/PACK) IMPLANT
WATER STERILE IRR 1500ML POUR (IV SOLUTION) ×3 IMPLANT

## 2016-03-28 NOTE — ED Triage Notes (Signed)
Per EMS patient from [facility].  Patient c/o left hip pain following a fall from the toilet.  Per EMS denies hitting head or LOC.  Patient has no other complaints.  Only has pain when she tries to stand and put weight on it.

## 2016-03-28 NOTE — Anesthesia Postprocedure Evaluation (Signed)
Anesthesia Post Note  Patient: Christine [name]  Procedure(s) Performed: Procedure(s) (LRB): ANTERIOR APPROACH HEMI HIP ARTHROPLASTY (Left)  Patient location during evaluation: PACU Anesthesia Type: Spinal Level of consciousness: oriented and awake and alert Pain management: pain level controlled Vital Signs Assessment: post-procedure vital signs reviewed and stable Respiratory status: spontaneous breathing, respiratory function stable and patient connected to nasal cannula oxygen Cardiovascular status: blood pressure returned to baseline and stable Postop Assessment: no headache and no backache Anesthetic complications: no    Last Vitals:  Vitals:   03/28/16 1844 03/28/16 1941  BP: (!) 177/57 (!) 159/47  Pulse: 76 94  Resp: 16 16  Temp: 36.7 C 37 C    Last Pain:  Vitals:   03/28/16 1941  TempSrc: Oral  PainSc:                  [place]

## 2016-03-28 NOTE — Consult Note (Signed)
Reason for Consult: Left hip fracture Referring Physician: Hospitalists  Christine [name] is an 82 y.o. female.  HPI: The patient is a 82 year old female with history of right total hip replacement who fell yesterday. She had significant pain in the left hip and she was brought to [facility] and admitted by the hospitalist. CAT scan showed valgus impacted femoral neck fracture in the setting of arthritic change on the femoral head and acetabulum. She was evaluated and we were consult for management of the patient.  Past Medical History:  Diagnosis Date  . Arthritis     History reviewed. No pertinent surgical history.  History reviewed. No pertinent family history.  Social History:  reports that she has never smoked. She has never used smokeless tobacco. She reports that she does not drink alcohol or use drugs.  Allergies: No Known Allergies  Medications: I have reviewed the patient's current medications.  Results for orders placed or performed during the hospital encounter of 03/28/16 (from the past 48 hour(s))  CBC with Differential     Status: Abnormal   Collection Time: 03/28/16  2:28 AM  Result Value Ref Range   WBC 23.1 (H) 4.0 - 10.5 K/uL   RBC 5.08 3.87 - 5.11 MIL/uL   Hemoglobin 15.3 (H) 12.0 - 15.0 g/dL   HCT 42.8 36.0 - 46.0 %   MCV 84.3 78.0 - 100.0 fL   MCH 30.1 26.0 - 34.0 pg   MCHC 35.7 30.0 - 36.0 g/dL   RDW 13.0 11.5 - 15.5 %   Platelets 538 (H) 150 - 400 K/uL   Neutrophils Relative % 89 %   Neutro Abs 20.4 (H) 1.7 - 7.7 K/uL   Lymphocytes Relative 6 %   Lymphs Abs 1.4 0.7 - 4.0 K/uL   Monocytes Relative 5 %   Monocytes Absolute 1.2 (H) 0.1 - 1.0 K/uL   Eosinophils Relative 0 %   Eosinophils Absolute 0.0 0.0 - 0.7 K/uL   Basophils Relative 0 %   Basophils Absolute 0.0 0.0 - 0.1 K/uL  Basic metabolic panel     Status: Abnormal   Collection Time: 03/28/16  2:28 AM  Result Value Ref Range   Sodium 134 (L) 135 - 145 mmol/L   Potassium 3.8 3.5 -  5.1 mmol/L   Chloride 100 (L) 101 - 111 mmol/L   CO2 24 22 - 32 mmol/L   Glucose, Bld 364 (H) 65 - 99 mg/dL   BUN 30 (H) 6 - 20 mg/dL   Creatinine, Ser 0.84 0.44 - 1.00 mg/dL   Calcium 9.4 8.9 - 10.3 mg/dL   GFR calc non Af Amer 59 (L) >60 mL/min   GFR calc Af Amer >60 >60 mL/min    Comment: (NOTE) The eGFR has been calculated using the CKD EPI equation. This calculation has not been validated in all clinical situations. eGFR's persistently <60 mL/min signify possible Chronic Kidney Disease.    Anion gap 10 5 - 15  Urinalysis, Routine w reflex microscopic (not at [facility])     Status: Abnormal   Collection Time: 03/28/16  3:43 AM  Result Value Ref Range   Color, Urine YELLOW YELLOW   APPearance CLOUDY (A) CLEAR   Specific Gravity, Urine 1.024 1.005 - 1.030   pH 5.5 5.0 - 8.0   Glucose, UA >1000 (A) NEGATIVE mg/dL   Hgb urine dipstick TRACE (A) NEGATIVE   Bilirubin Urine NEGATIVE NEGATIVE   Ketones, ur NEGATIVE NEGATIVE mg/dL   Protein, ur 30 (A) NEGATIVE  mg/dL   Nitrite NEGATIVE NEGATIVE   Leukocytes, UA TRACE (A) NEGATIVE  Type and screen [facility]     Status: None   Collection Time: 03/28/16  3:43 AM  Result Value Ref Range   ABO/RH(D) A POS    Antibody Screen NEG    Sample Expiration 03/31/2016   Urine microscopic-add on     Status: Abnormal   Collection Time: 03/28/16  3:43 AM  Result Value Ref Range   Squamous Epithelial / LPF NONE SEEN NONE SEEN   WBC, UA 6-30 0 - 5 WBC/hpf   RBC / HPF NONE SEEN 0 - 5 RBC/hpf   Bacteria, UA MANY (A) NONE SEEN  ABO/Rh     Status: None   Collection Time: 03/28/16  3:43 AM  Result Value Ref Range   ABO/RH(D) A POS   Troponin I     Status: None   Collection Time: 03/28/16  4:48 AM  Result Value Ref Range   Troponin I <0.03 <0.03 ng/mL  Protime-INR     Status: None   Collection Time: 03/28/16  5:51 AM  Result Value Ref Range   Prothrombin Time 13.3 11.4 - 15.2 seconds   INR 1.01   Glucose, capillary      Status: Abnormal   Collection Time: 03/28/16  6:28 AM  Result Value Ref Range   Glucose-Capillary 341 (H) 65 - 99 mg/dL  Glucose, capillary     Status: Abnormal   Collection Time: 03/28/16  8:14 AM  Result Value Ref Range   Glucose-Capillary 276 (H) 65 - 99 mg/dL  Surgical PCR screen     Status: None   Collection Time: 03/28/16  9:05 AM  Result Value Ref Range   MRSA, PCR NEGATIVE NEGATIVE   Staphylococcus aureus NEGATIVE NEGATIVE    Comment:        The Xpert SA Assay (FDA approved for NASAL specimens in patients over 19 years of age), is one component of a comprehensive surveillance program.  Test performance has been validated by [facility] for patients greater than or equal to 35 year old. It is not intended to diagnose infection nor to guide or monitor treatment.   Glucose, capillary     Status: Abnormal   Collection Time: 03/28/16 12:18 PM  Result Value Ref Range   Glucose-Capillary 260 (H) 65 - 99 mg/dL    Dg Chest 1 View  Result Date: 03/28/2016 CLINICAL DATA:  Preoperative chest radiograph for left hip fracture. Initial encounter. EXAM: CHEST 1 VIEW COMPARISON:  Chest radiograph performed 02/25/2011 FINDINGS: The lungs are well-aerated and clear. There is no evidence of focal opacification, pleural effusion or pneumothorax. The heart is normal in size; the mediastinal contour is within normal limits. No acute osseous abnormalities are seen. IMPRESSION: No acute cardiopulmonary process seen. No displaced rib fractures identified. Electronically Signed   By: Garald Balding M.D.   On: 03/28/2016 03:18   Ct Hip Left Wo Contrast  Result Date: 03/28/2016 CLINICAL DATA:  Hip pain after fall. EXAM: CT OF THE LEFT HIP WITHOUT CONTRAST TECHNIQUE: Multidetector CT imaging of the left hip was performed according to the standard protocol. Multiplanar CT image reconstructions were also generated. COMPARISON:  Pelvis radiography from earlier today FINDINGS: Bones/Joint/Cartilage Left  femoral neck fracture with mild subcapital impaction laterally. No intertrochanteric or trochanteric involvement. Hip osteoarthritis with mild to moderate acetabular spurring. Ligaments Suboptimally assessed by CT. Muscles and Tendons No gross myotendinous disruption. Soft tissues Sigmoid diverticulosis.  Suspension anchors  on the pubic bodies. IMPRESSION: 1. Mildly impacted left femoral neck fracture. 2. Acetabular spurring. Electronically Signed   By: Monte Fantasia M.D.   On: 03/28/2016 01:57   Dg Hip Unilat W Or Wo Pelvis 2-3 Views Left  Result Date: 03/28/2016 CLINICAL DATA:  Left hip pain following fall from toilet. EXAM: DG HIP (WITH OR WITHOUT PELVIS) 2-3V LEFT COMPARISON:  Right hip radiograph 03/11/2016 FINDINGS: There is severe narrowing of the left hip joint space with associated subchondral sclerosis and lateral acetabular osteophyte formation. Mild flattening of the left femoral head. No visible fracture or dislocation. There is a right hip total arthroplasty in anatomic alignment without adverse features. Visualized soft tissues and pelvic bowel gas pattern are normal. IMPRESSION: 1. No radiographically evident acute fracture or dislocation of the left hip. In the setting of trauma, MRI may be helpful for the detection of nondisplaced radiooccult hip fracture in a patient of this age, if clinically indicated. 2. Moderate left hip osteoarthrosis. 3. Right total hip arthroplasty without adverse features. Electronically Signed   By: Ulyses Jarred M.D.   On: 03/28/2016 01:11    ROS  ROS: I have reviewed the patient's review of systems thoroughly and there are no positive responses as relates to the HPI. EXAM: Blood pressure (!) 167/47, pulse 81, temperature 98.5 F (36.9 C), temperature source Oral, resp. rate 18, height '5\' 3"'$  (1.6 m), weight 60.5 kg (133 lb 6.1 oz), SpO2 94 %. Physical Exam Well-developed well-nourished patient in no acute distress. Alert and oriented x3 HEENT:within  normal limits Cardiac: Regular rate and rhythm Pulmonary: Lungs clear to auscultation Abdomen: Soft and nontender.  Normal active bowel sounds  Musculoskeletal: (Left hip external rotation and shortened. Painful range of motion. Neurovascular intact distally.) Assessment/Plan: 82 year old female with nondisplaced left femoral neck fracture in a patient who is a community ambulator.//I had a prolonged patient discussion with the patient and her son today. The treatment options would be use of a walker or without surgical intervention. Percutaneous screw fixation of her femoral neck fracture. Hemiarthroplasty or total hip replacement. Artemis can be made for any of these treatment options. After thorough discussion I felt that the appropriate course of action was hemiarthroplasty. This would give her the best chance of being able to bear weight fully as soon as possible. I'll do this through an anterior approach to also help her rehabilitative efforts. The patient and her son certainly understood the risks of bleeding infection continued pain and need for further surgery. They understood there is a slight risk of death at the time of surgery or in the perioperative period. They understand these risks and wish to proceed.  Dionel Archey L 03/28/2016, 2:11 PM

## 2016-03-28 NOTE — ED Provider Notes (Signed)
[facility] DEPT Provider Note   CSN: [id number] Arrival date & time: 03/28/16  0008  By signing my name below, I, Reola Mosher, attest that this documentation has been prepared under the direction and in the presence of [employer], PA-C.  Electronically Signed: Reola Mosher, ED Scribe. 03/28/16. 12:40 AM.  History   Chief Complaint Chief Complaint  Patient presents with  . Fall   The history is provided by the patient and the EMS personnel. No language interpreter was used.   HPI Comments: Christine [name] is a 82 y.o. female BIB EMS from [facility], with a PMHx of arthritis, who presents to the Emergency Department complaining of sudden onset, unchanged, constant left hip pain s/p ground-level fall that occurred PTA. Pt reports that she was sitting on her toilet using the bathroom, and when she attempted to stand up her left foot slipped from underneath her, causing her to fall onto the left side of her body. Denies LOC or head injury. Her pain is exacerbated with ambulation and bearing weight. No noted OTC medications or home remedies tried PTA. Denies nausea, vomiting, or any other associated symptoms.   Past Medical History:  Diagnosis Date  . Arthritis    There are no active problems to display for this patient.  History reviewed. No pertinent surgical history.  OB History    No data available     Home Medications    Prior to Admission medications   Medication Sig Start Date End Date Taking? Authorizing Provider  acetaminophen (TYLENOL) 500 MG tablet Take 500 mg by mouth every 6 (six) hours as needed for mild pain or headache.   Yes Historical Provider, MD  PRESCRIPTION MEDICATION Take 1 tablet by mouth 2 (two) times daily.   Yes Historical Provider, MD   Family History No family history on file.  Social History Social History  Substance Use Topics  . Smoking status: Never Smoker  . Smokeless tobacco: Never Used  . Alcohol use No    Allergies   Review of patient's allergies indicates no known allergies.  Review of Systems Review of Systems  Gastrointestinal: Negative for nausea and vomiting.  Musculoskeletal: Positive for arthralgias (left hip) and myalgias.  Neurological: Negative for syncope.  Ten systems reviewed and are negative for acute change, except as noted in the HPI.    Physical Exam Updated Vital Signs BP 200/64 (BP Location: Right Arm) Comment: RN,Stewart made aware of pt. elevated blood pressure.  Pulse 84   Temp 97.9 F (36.6 C) (Oral)   Resp 19   SpO2 97%   Physical Exam  Constitutional: She is oriented to person, place, and time. She appears well-developed and well-nourished. No distress.  Nontoxic appearing and in NAD  HENT:  Head: Normocephalic and atraumatic.  Eyes: Conjunctivae and EOM are normal. No scleral icterus.  Neck: Normal range of motion.  Cardiovascular: Normal rate, regular rhythm and intact distal pulses.   DP pulses 1+ b/l  Pulmonary/Chest: Effort normal. No respiratory distress.  Respirations even and unlabored.  Musculoskeletal: She exhibits tenderness.  TTP to anterior left hip in the inguinal region. No bony deformity or crepitus. No leg shortening or malrotation.  Neurological: She is alert and oriented to person, place, and time.  GCS 15. Sensation to light touch intact in BLE. Patient able to wiggle all toes.  Skin: Skin is warm and dry. No rash noted. She is not diaphoretic. No erythema. No pallor.  Psychiatric: She has a normal mood and  affect. Her behavior is normal.  Nursing note and vitals reviewed.   ED Treatments / Results  DIAGNOSTIC STUDIES: Oxygen Saturation is 92% on RA, low by my interpretation.   COORDINATION OF CARE: 12:40 AM-Discussed next steps with pt. Pt verbalized understanding and is agreeable with the plan.   Labs (all labs ordered are listed, but only abnormal results are displayed) Labs Reviewed  CBC WITH DIFFERENTIAL/PLATELET  - Abnormal; Notable for the following:       Result Value   WBC 23.1 (*)    Hemoglobin 15.3 (*)    Platelets 538 (*)    Neutro Abs 20.4 (*)    Monocytes Absolute 1.2 (*)    All other components within normal limits  BASIC METABOLIC PANEL - Abnormal; Notable for the following:    Sodium 134 (*)    Chloride 100 (*)    Glucose, Bld 364 (*)    BUN 30 (*)    GFR calc non Af Amer 59 (*)    All other components within normal limits    Radiology Ct Hip Left Wo Contrast  Result Date: 03/28/2016 CLINICAL DATA:  Hip pain after fall. EXAM: CT OF THE LEFT HIP WITHOUT CONTRAST TECHNIQUE: Multidetector CT imaging of the left hip was performed according to the standard protocol. Multiplanar CT image reconstructions were also generated. COMPARISON:  Pelvis radiography from earlier today FINDINGS: Bones/Joint/Cartilage Left femoral neck fracture with mild subcapital impaction laterally. No intertrochanteric or trochanteric involvement. Hip osteoarthritis with mild to moderate acetabular spurring. Ligaments Suboptimally assessed by CT. Muscles and Tendons No gross myotendinous disruption. Soft tissues Sigmoid diverticulosis.  Suspension anchors on the pubic bodies. IMPRESSION: 1. Mildly impacted left femoral neck fracture. 2. Acetabular spurring. Electronically Signed   By: Monte Fantasia M.D.   On: 03/28/2016 01:57   Dg Hip Unilat W Or Wo Pelvis 2-3 Views Left  Result Date: 03/28/2016 CLINICAL DATA:  Left hip pain following fall from toilet. EXAM: DG HIP (WITH OR WITHOUT PELVIS) 2-3V LEFT COMPARISON:  Right hip radiograph 03/11/2016 FINDINGS: There is severe narrowing of the left hip joint space with associated subchondral sclerosis and lateral acetabular osteophyte formation. Mild flattening of the left femoral head. No visible fracture or dislocation. There is a right hip total arthroplasty in anatomic alignment without adverse features. Visualized soft tissues and pelvic bowel gas pattern are normal.  IMPRESSION: 1. No radiographically evident acute fracture or dislocation of the left hip. In the setting of trauma, MRI may be helpful for the detection of nondisplaced radiooccult hip fracture in a patient of this age, if clinically indicated. 2. Moderate left hip osteoarthrosis. 3. Right total hip arthroplasty without adverse features. Electronically Signed   By: Ulyses Jarred M.D.   On: 03/28/2016 01:11    Procedures Procedures (including critical care time)  Medications Ordered in ED Medications - No data to display  Initial Impression / Assessment and Plan / ED Course  I have reviewed the triage vital signs and the nursing notes.  Pertinent labs & imaging results that were available during my care of the patient were reviewed by me and considered in my medical decision making (see chart for details).  Clinical Course    82 year old female presents after mechanical fall at home. She denies head trauma and loss of consciousness. She is neurovascularly intact. Patient with complaints of left hip pain. She was found to have an impacted left femoral neck fracture on CT scan. Case discussed with Dr. Ninfa Linden of orthopedics who will evaluate  the patient in the morning. Patient admitted to [facility] for further inpatient management.   Final Clinical Impressions(s) / ED Diagnoses   Final diagnoses:  Femoral neck fracture, left, closed, initial encounter     New Prescriptions New Prescriptions   No medications on file   I personally performed the services described in this documentation, which was scribed in my presence. The recorded information has been reviewed and is accurate.      Antonietta Breach, PA-C 03/28/16 [id number]    Leo Grosser, MD 03/30/16 [phone]

## 2016-03-28 NOTE — Anesthesia Procedure Notes (Signed)
Spinal  Patient location during procedure: OR Start time: 03/28/2016 2:15 PM End time: 03/28/2016 2:21 PM Staffing Anesthesiologist: Marcie Bal, ADAM Performed: anesthesiologist  Preanesthetic Checklist Completed: patient identified, site marked, surgical consent, pre-op evaluation, timeout performed, IV checked, risks and benefits discussed and monitors and equipment checked Spinal Block Patient position: left lateral decubitus Prep: Betadine and site prepped and draped Patient monitoring: heart rate, cardiac monitor, continuous pulse ox and blood pressure Approach: midline Location: L3-4 Injection technique: single-shot Needle Needle gauge: 22 G Needle length: 9 cm Assessment Sensory level: T6 Additional Notes Pt tolerated the procedure well.

## 2016-03-28 NOTE — Discharge Instructions (Signed)

## 2016-03-28 NOTE — Care Management Note (Signed)
Case Management Note  Patient Details  Name: Christine [name] MRN: [id number] Date of Birth: 09-07-1921  Subjective/Objective:  Left hip fracture, surgery 03/28/2016                  Action/Plan: Discharge Planning: Chart reviewed. Waiting final recommendations for home.   Expected Discharge Date:                  Expected Discharge Plan:  Skilled Nursing Facility  In-House Referral:  Clinical Social Work  Discharge planning Services  CM Consult  Post Acute Care Choice:    Choice offered to:     DME Arranged:    DME Agency:     HH Arranged:    [facility] Agency:     Status of Service:  In process, will continue to follow  If discussed at Long Length of Stay Meetings, dates discussed:    Additional Comments:  Erenest Rasher, RN 03/28/2016, 3:29 PM

## 2016-03-28 NOTE — Brief Op Note (Signed)
03/28/2016  3:38 PM  PATIENT:  Christine [name]  82 y.o. female  PRE-OPERATIVE DIAGNOSIS:  left hip fracture  POST-OPERATIVE DIAGNOSIS:  left hip fracture  PROCEDURE:  Procedure(s): ANTERIOR APPROACH HEMI HIP ARTHROPLASTY (Left)  SURGEON:  Surgeon(s) and Role:    * Dorna Leitz, MD - Primary  PHYSICIAN ASSISTANT:   ASSISTANTS: bethune   ANESTHESIA:   spinal  EBL:  Total I/O In: 735 [I.V.:735] Out: 850 [Urine:850]  BLOOD ADMINISTERED:none  DRAINS: none   LOCAL MEDICATIONS USED:  MARCAINE    and OTHER X Burrell  SPECIMEN:  No Specimen  DISPOSITION OF SPECIMEN:  N/A  COUNTS:  YES  TOURNIQUET:  * No tourniquets in log *  DICTATION: .Other Dictation: Dictation Number none given  PLAN OF CARE: Admit to inpatient   PATIENT DISPOSITION:  PACU - hemodynamically stable.   Delay start of Pharmacological VTE agent (>24hrs) due to surgical blood loss or risk of bleeding: no

## 2016-03-28 NOTE — Progress Notes (Signed)
Christine [name] is a 82 y.o. female with medical history significant for osteoporosis and chronic osteoarthritis managed with as needed Tylenol who presents from her independent living facility with severe left hip pain following a ground-level mechanical fall. orthpedics consulted ad plan for left hemiarthroplasty today.  Would continue with IV rocephin for possible UTI, pending urine cultures. Currently she denies any symptoms.  Monitor.   Hosie Poisson, MD [phone]

## 2016-03-28 NOTE — Op Note (Signed)
NAME:  Christine [name], Christine [name] NO.:  [id number]  MEDICAL RECORD NO.:  [id number]  LOCATION:  [facility]                         FACILITY:  [facility]  PHYSICIAN:  Alta Corning, M.D.   DATE OF BIRTH:  1921-10-11  DATE OF PROCEDURE:  03/28/2016 DATE OF DISCHARGE:                              OPERATIVE REPORT   PREOPERATIVE DIAGNOSIS:  Femoral neck fracture, left.  POSTOPERATIVE DIAGNOSIS:  Femoral neck fracture, left.  PROCEDURE: 1.Left hemiarthroplasty with a Corail stem size 12, 46 mm hip ball monopolar, and a -3 neck. 2. Interpretation of multiple intraoperative fluoroscopic images SURGEON:  Alta Corning, M.D.  ASSISTANT:  Gary Fleet, PA  ANESTHESIA:  General.  BRIEF HISTORY:  Christine [name] is a 82 year old female with a long history of significant complaints of left hip pain after a fall off the commode. She was evaluated in the hospital and noted to have a femoral neck fracture.  It was in valgus impaction.  We had a long talk with she and her family about treatment options, but ultimately settled upon hemiarthroplasty as most appropriate course of action for this patient. She is brought to the operating room for this procedure.  DESCRIPTION OF PROCEDURE:  The patient was brought to the operative room.  After adequate anesthesia was obtained with general anesthetic, the patient was placed supine on the HANA bed.  The patient was then prepped and draped for an anterior approach to the hip.  At this point, an incision was made for an anterior approach to the hip, subcutaneous tissue down the level of the tensor fascia.  Tensor fascia was identified and divided and retractors were put in place and the capsule was opened and tagged.  At this point, a provisional neck cut was made and the head and remaining portions of the neck were removed.  Following this, attention was turned towards the acetabulum where no evidence of full-thickness cartilage loss was identified.   All fragments of bone were removed and the head was back to be a 46, 46 was placed and had easy mobility.  Once that was completed, attention was turned towards the stem side, but the capsule was released.  The hip was externally rotated, put in a down and over position, and following this, the stem was sequentially rasped to a level of 12.  Once the 12 was chosen, the hip was reduced with a 36-mm hip ball.  We did the map to accept that the 46 was going to be accurate and if we could get a couple more mm of impaction.  The hip was then dislocated, the 12 was advanced a couple more mm, we calcar planed, and then put a Corail stem and size 12 down, went all the way down right to the column.  Once that was done, the 46 mm -3 neck was chosen and the hip was placed and reduced.  Excellent reduction was achieved.  The capsule was repaired and the final images were taken showing that we had an excellent fit and fill and symmetric leg length.  At this point, the capsule was closed with interrupted Vicryl sutures.  The tensor fascia was closed with 1- Vicryl running, the  skin with 0 and 2-0 Vicryl, and 3-0 Monocryl subcuticular.  Benzoin and Steri-Strips were applied.  Sterile compressive dressing was applied.  The patient was taken to the recovery and was noted to be in a satisfactory condition.  Estimated blood loss for the procedure was minimal.     Alta Corning, M.D.     Corliss Skains  D:  03/28/2016  T:  03/28/2016  Job:  [id number]

## 2016-03-28 NOTE — ED Notes (Signed)
Bed: WA02 Expected date:  Expected time:  Means of arrival:  Comments: 21F fall

## 2016-03-28 NOTE — H&P (Signed)
History and Physical    Christine [name] [id number] DOB: 08-13-1921 DOA: 03/28/2016  PCP: No primary care provider on file.   Patient coming from: Independent living facility   Chief Complaint: Left hip after fall   HPI: Christine [name] is a 82 y.o. female with medical history significant for osteoporosis and chronic osteoarthritis managed with as needed Tylenol who presents from her independent living facility with severe left hip pain following a ground-level mechanical fall. Patient has had some urinary urgency and frequency, but it otherwise been in her usual state of health and was attempting to get to the bathroom when she fell onto her left side and experienced immediate pain at the left hip. She was unable to bear weight following the fall. She is not anticoagulated and denies any head strike or loss of consciousness. She denies any recent fevers or chills, chest pain or palpitations, dyspnea or cough. She denies any history of pulmonary disease. When asked about heart disease and if she's ever had a heart attack, she reports having a heart attack approximately 4 years ago for which she never sought medical care, but "broke it" by drinking a potion of cayenne pepper powder in water. She has history of right hip fracture in 1980 and right ankle fracture in 1995 and was previously on Fosamax. She had DEXA scan in 2004 with osteopenia in the left hip and normal density lumbar spine.  ED Course: Upon arrival to the ED, patient is found to be afebrile, saturating well on room air, hypertensive to 207/75, and with vitals otherwise stable. EKG demonstrates sinus rhythm with APC. Chest x-ray is negative for acute cardiopulmonary disease. Radiographs of the hips and pelvis feature osteoarthritis of the left hip and prior right THA which appears stable. Patient was not able to bear weight and CT revealed the mildly impacted left femoral neck fracture. Chemistry panel is notable for a serum glucose of  364 and mild hyponatremia and hypochloremia. CBC features a leukocytosis to 23,100, thrombocytosis with platelet count of 538,000, and hemoglobin elevated to 15.3. Urinalysis is consistent with infection and will be sent for culture. Patient was treated symptomatically in the ED with fentanyl and Zofran and orthopedic surgeon was consulted. ED provider discussed the case with Dr. Rush Farmer of orthopedic surgery and a medical admission to [facility] was requested. Patient will be admitted to the medical-surgical unit for ongoing evaluation and management of closed left femoral neck fracture and UTI.   Review of Systems:  All other systems reviewed and apart from HPI, are negative.  Past Medical History:  Diagnosis Date  . Arthritis     History reviewed. No pertinent surgical history.   reports that she has never smoked. She has never used smokeless tobacco. She reports that she does not drink alcohol or use drugs.  No Known Allergies  History reviewed. No pertinent family history.   Prior to Admission medications   Medication Sig Start Date End Date Taking? Authorizing Provider  acetaminophen (TYLENOL) 500 MG tablet Take 500 mg by mouth every 6 (six) hours as needed for mild pain or headache.   Yes Historical Provider, MD  PRESCRIPTION MEDICATION Take 1 tablet by mouth 2 (two) times daily.   Yes Historical Provider, MD    Physical Exam: Vitals:   03/28/16 0011 03/28/16 0232 03/28/16 0401 03/28/16 0440  BP: (!) 207/75 200/64 (!) 232/72 (!) 221/64  Pulse: 76 84 82   Resp: 17 19 16    Temp: 97.8 F (36.6  C) 97.9 F (36.6 C) 98.7 F (37.1 C)   TempSrc: Oral Oral Oral   SpO2: 92% 97% 96%       Constitutional: NAD, calm, comfortable Eyes: PERTLA, lids and conjunctivae normal ENMT: Mucous membranes are dry. Posterior pharynx clear of any exudate or lesions.   Neck: normal, supple, no masses, no thyromegaly Respiratory: clear to auscultation bilaterally, no wheezing, no  crackles. Normal respiratory effort.   Cardiovascular: S1 & S2 heard, regular rate and rhythm, soft systolic murmur at LSB. No extremity edema. 1+ pedal pulses symmetric. No significant JVD. Abdomen: No distension, mild suprapubic tenderness, no masses palpated. Bowel sounds normal.  Musculoskeletal: no clubbing / cyanosis. Left hip exquisitely tender, neurovascularly intact distally.   Skin: no significant rashes, lesions, ulcers. Warm, dry, well-perfused. Neurologic: CN 2-12 grossly intact. Sensation intact, DTR normal. Strength 5/5 in all 4 limbs (LLE not tested).  Psychiatric: Normal judgment and insight. Alert and oriented x 3. Normal mood and affect.     Labs on Admission: I have personally reviewed following labs and imaging studies  CBC:  Recent Labs Lab 03/28/16 0228  WBC 23.1*  NEUTROABS 20.4*  HGB 15.3*  HCT 42.8  MCV 84.3  PLT [id number]*   Basic Metabolic Panel:  Recent Labs Lab 03/28/16 0228  NA 134*  K 3.8  CL 100*  CO2 24  GLUCOSE 364*  BUN 30*  CREATININE 0.84  CALCIUM 9.4   GFR: CrCl cannot be calculated (Unknown ideal weight.). Liver Function Tests: No results for input(s): AST, ALT, ALKPHOS, BILITOT, PROT, ALBUMIN in the last 168 hours. No results for input(s): LIPASE, AMYLASE in the last 168 hours. No results for input(s): AMMONIA in the last 168 hours. Coagulation Profile: No results for input(s): INR, PROTIME in the last 168 hours. Cardiac Enzymes: No results for input(s): CKTOTAL, CKMB, CKMBINDEX, TROPONINI in the last 168 hours. BNP (last 3 results) No results for input(s): PROBNP in the last 8760 hours. HbA1C: No results for input(s): HGBA1C in the last 72 hours. CBG: No results for input(s): GLUCAP in the last 168 hours. Lipid Profile: No results for input(s): CHOL, HDL, LDLCALC, TRIG, CHOLHDL, LDLDIRECT in the last 72 hours. Thyroid Function Tests: No results for input(s): TSH, T4TOTAL, FREET4, T3FREE, THYROIDAB in the last 72  hours. Anemia Panel: No results for input(s): VITAMINB12, FOLATE, FERRITIN, TIBC, IRON, RETICCTPCT in the last 72 hours. Urine analysis:    Component Value Date/Time   COLORURINE YELLOW 03/28/2016 0343   APPEARANCEUR CLOUDY (A) 03/28/2016 0343   LABSPEC 1.024 03/28/2016 0343   PHURINE 5.5 03/28/2016 0343   GLUCOSEU >1000 (A) 03/28/2016 0343   HGBUR TRACE (A) 03/28/2016 0343   BILIRUBINUR NEGATIVE 03/28/2016 0343   KETONESUR NEGATIVE 03/28/2016 0343   PROTEINUR 30 (A) 03/28/2016 0343   UROBILINOGEN 0.2 02/25/2011 1252   NITRITE NEGATIVE 03/28/2016 0343   LEUKOCYTESUR TRACE (A) 03/28/2016 0343   Sepsis Labs: @LABRCNTIP (procalcitonin:4,lacticidven:4) )No results found for this or any previous visit (from the past 240 hour(s)).   Radiological Exams on Admission: Dg Chest 1 View  Result Date: 03/28/2016 CLINICAL DATA:  Preoperative chest radiograph for left hip fracture. Initial encounter. EXAM: CHEST 1 VIEW COMPARISON:  Chest radiograph performed 02/25/2011 FINDINGS: The lungs are well-aerated and clear. There is no evidence of focal opacification, pleural effusion or pneumothorax. The heart is normal in size; the mediastinal contour is within normal limits. No acute osseous abnormalities are seen. IMPRESSION: No acute cardiopulmonary process seen. No displaced rib fractures identified. Electronically Signed  By: Garald Balding M.D.   On: 03/28/2016 03:18   Ct Hip Left Wo Contrast  Result Date: 03/28/2016 CLINICAL DATA:  Hip pain after fall. EXAM: CT OF THE LEFT HIP WITHOUT CONTRAST TECHNIQUE: Multidetector CT imaging of the left hip was performed according to the standard protocol. Multiplanar CT image reconstructions were also generated. COMPARISON:  Pelvis radiography from earlier today FINDINGS: Bones/Joint/Cartilage Left femoral neck fracture with mild subcapital impaction laterally. No intertrochanteric or trochanteric involvement. Hip osteoarthritis with mild to moderate acetabular  spurring. Ligaments Suboptimally assessed by CT. Muscles and Tendons No gross myotendinous disruption. Soft tissues Sigmoid diverticulosis.  Suspension anchors on the pubic bodies. IMPRESSION: 1. Mildly impacted left femoral neck fracture. 2. Acetabular spurring. Electronically Signed   By: Monte Fantasia M.D.   On: 03/28/2016 01:57   Dg Hip Unilat W Or Wo Pelvis 2-3 Views Left  Result Date: 03/28/2016 CLINICAL DATA:  Left hip pain following fall from toilet. EXAM: DG HIP (WITH OR WITHOUT PELVIS) 2-3V LEFT COMPARISON:  Right hip radiograph 03/11/2016 FINDINGS: There is severe narrowing of the left hip joint space with associated subchondral sclerosis and lateral acetabular osteophyte formation. Mild flattening of the left femoral head. No visible fracture or dislocation. There is a right hip total arthroplasty in anatomic alignment without adverse features. Visualized soft tissues and pelvic bowel gas pattern are normal. IMPRESSION: 1. No radiographically evident acute fracture or dislocation of the left hip. In the setting of trauma, MRI may be helpful for the detection of nondisplaced radiooccult hip fracture in a patient of this age, if clinically indicated. 2. Moderate left hip osteoarthrosis. 3. Right total hip arthroplasty without adverse features. Electronically Signed   By: Ulyses Jarred M.D.   On: 03/28/2016 01:11    EKG: Independently reviewed. Sinus rhythm, APC   Assessment/Plan  1. Left femoral neck fracture   - Pt suffered a ground-level mechanical fall getting to the bathroom at her ILF without head strike or LOC  - Unable to bear wt on left leg, radiographs neg for fracture, but CT reveals mildly impacted Lt femoral neck fx  - Orthopedic surgery was consulted by the EDP and requested medical admission  - There is no known hx of underlying cardiopulmonary disease; there is a soft systolic murmur likely AS  - EKG with sinus rhythm, no acute ST-T change, and no Q-wave  - CXR neg for  acute cardiopulmonary disease  - Type and screen performed  - UA c/w infection, of which pt has sxs, treating as below  - Dehydrated on admission and will receive NS at 100 cc/hr overnight  - Pain-control with ice pack, prn analgesics  - Control hypertension as below; not previously on a beta-blocker, will treat with prn hydralazine pushes as below  - Hyperglycemia managed as below  - Pt's perioperative cardiopulmonary risk considered to be low based on RF's but is likely underestimated d/t advanced age  25. UTI  - Pt has urinary sxs and UA suggests infection; culture requested  - Prior urine culture grew E coli resistant to ampicillin only  - Empiric Rocephin started while awaiting culture data   3. Hypertension   - Hypertensive to 207/75 in ED - Likely secondary to pain  - Control pain with ice pack and prn Dilaudid  - Hydralazine IVP's available prn HTN   4. Type II DM  - Serum glucose 364 on admission with >1k urine glucose  - A1c was 8.6% in 2012 but she is not currently under  treatment according to medication list  - Check CBG q4h while NPO for surgery, change to with meals and qHS once diet ordered  - Start with low-intensity SSI correctional only and adjust prn  - Update A1c, ordered   5. Hyponatremia  - Mild, associated with dehydration  - Hydrate with NS at 100 cc/hr    6. Leukocytosis, thrombocytosis - WBC 23,100 on admission and secondary to the UTI and hip fracture  - Pt on empiric abx for the UTI as above  - Platelets 538,000 on admission, likely representing an acute phase reactant in setting of hip fx     DVT prophylaxis: Per surgical team, right SCD for now Code Status: Full   Family Communication: Discussed with patient; son Rosanna Randy not available by phone at time of admit, cell [phone] Disposition Plan: Admit to med-surg Consults called: Orthopedic surgery  Admission status: Inpatient    Vianne Bulls, MD [facility] Pager  [phone]  If 7PM-7AM, please contact night-coverage [email or web address] Password [email or web address]  03/28/2016, 5:05 AM

## 2016-03-28 NOTE — Transfer of Care (Signed)
Immediate Anesthesia Transfer of Care Note  Patient: Christine [name]  Procedure(s) Performed: Procedure(s): ANTERIOR APPROACH HEMI HIP ARTHROPLASTY (Left)  Patient Location: PACU  Anesthesia Type:Spinal  Level of Consciousness:  sedated, patient cooperative and responds to stimulation  Airway & Oxygen Therapy:Patient Spontanous Breathing and Patient connected to face mask oxgen  Post-op Assessment:  Report given to PACU RN and Post -op Vital signs reviewed and stable  Post vital signs:  Reviewed and stable  Last Vitals:  Vitals:   03/28/16 0643 03/28/16 1004  BP: (!) 166/47 (!) 167/47  Pulse: 80 81  Resp:  18  Temp:  123456 C    Complications: No apparent anesthesia complications

## 2016-03-28 NOTE — Anesthesia Preprocedure Evaluation (Addendum)
Anesthesia Evaluation  Patient identified by MRN, date of birth, ID band Patient awake    Reviewed: Allergy & Precautions, H&P , NPO status , Patient's Chart, lab work & pertinent test results  Airway Mallampati: II   Neck ROM: full    Dental   Pulmonary    breath sounds clear to auscultation       Cardiovascular hypertension,  Rhythm:regular Rate:Normal     Neuro/Psych    GI/Hepatic   Endo/Other  diabetes, Type 2  Renal/GU      Musculoskeletal  (+) Arthritis ,   Abdominal   Peds  Hematology   Anesthesia Other Findings   Reproductive/Obstetrics                             Anesthesia Physical Anesthesia Plan  ASA: II  Anesthesia Plan: MAC and Spinal   Post-op Pain Management:    Induction: Intravenous  Airway Management Planned: Simple Face Mask  Additional Equipment:   Intra-op Plan:   Post-operative Plan:   Informed Consent: I have reviewed the patients History and Physical, chart, labs and discussed the procedure including the risks, benefits and alternatives for the proposed anesthesia with the patient or authorized representative who has indicated his/her understanding and acceptance.     Plan Discussed with: CRNA, Anesthesiologist and Surgeon  Anesthesia Plan Comments:        Anesthesia Quick Evaluation

## 2016-03-28 NOTE — ED Notes (Signed)
Patient transported to X-ray 

## 2016-03-28 NOTE — ED Notes (Signed)
Report given to floor, drawing type and screen prior to taking her to room.

## 2016-03-29 DIAGNOSIS — S72002A Fracture of unspecified part of neck of left femur, initial encounter for closed fracture: Principal | ICD-10-CM

## 2016-03-29 DIAGNOSIS — E871 Hypo-osmolality and hyponatremia: Secondary | ICD-10-CM

## 2016-03-29 DIAGNOSIS — I159 Secondary hypertension, unspecified: Secondary | ICD-10-CM

## 2016-03-29 DIAGNOSIS — N3000 Acute cystitis without hematuria: Secondary | ICD-10-CM

## 2016-03-29 LAB — GLUCOSE, CAPILLARY
GLUCOSE-CAPILLARY: 247 mg/dL — AB (ref 65–99)
GLUCOSE-CAPILLARY: 348 mg/dL — AB (ref 65–99)
Glucose-Capillary: 341 mg/dL — ABNORMAL HIGH (ref 65–99)
Glucose-Capillary: 237 mg/dL — ABNORMAL HIGH (ref 65–99)

## 2016-03-29 LAB — BASIC METABOLIC PANEL
ANION GAP: 9 (ref 5–15)
BUN: 14 mg/dL (ref 6–20)
CHLORIDE: 103 mmol/L (ref 101–111)
CO2: 23 mmol/L (ref 22–32)
CREATININE: 0.81 mg/dL (ref 0.44–1.00)
Calcium: 8.2 mg/dL — ABNORMAL LOW (ref 8.9–10.3)
GFR calc non Af Amer: >60 mL/min (ref >60)
Glucose, Bld: 298 mg/dL — ABNORMAL HIGH (ref 65–99)
POTASSIUM: 3.9 mmol/L (ref 3.5–5.1)
Sodium: 135 mmol/L (ref 135–145)

## 2016-03-29 LAB — CBC
HEMATOCRIT: 38.1 % (ref 36.0–46.0)
HEMOGLOBIN: 13 g/dL (ref 12.0–15.0)
MCH: 30 pg (ref 26.0–34.0)
MCHC: 34.1 g/dL (ref 30.0–36.0)
MCV: 87.8 fL (ref 78.0–100.0)
Platelets: 400 10*3/uL (ref 150–400)
RBC: 4.34 MIL/uL (ref 3.87–5.11)
RDW: 13.3 % (ref 11.5–15.5)
WBC: 10.7 10*3/uL — ABNORMAL HIGH (ref 4.0–10.5)

## 2016-03-29 LAB — HEMOGLOBIN A1C
Hgb A1c MFr Bld: 13.9 % — ABNORMAL HIGH (ref 4.8–5.6)
Mean Plasma Glucose: 352 mg/dL

## 2016-03-29 MED ORDER — INSULIN ASPART 100 UNIT/ML ~~LOC~~ SOLN
0.0000 [IU] | Freq: Every day | SUBCUTANEOUS | Status: DC
  Administered 2016-03-29: 2 [IU] via SUBCUTANEOUS

## 2016-03-29 MED ORDER — DEXTROSE 5 % IV SOLN
1.0000 g | INTRAVENOUS | Status: DC
  Administered 2016-03-29: 1 g via INTRAVENOUS
  Filled 2016-03-29 (×2): qty 10

## 2016-03-29 MED ORDER — INSULIN ASPART 100 UNIT/ML ~~LOC~~ SOLN
0.0000 [IU] | Freq: Three times a day (TID) | SUBCUTANEOUS | Status: DC
  Administered 2016-03-29: 15 [IU] via SUBCUTANEOUS
  Administered 2016-03-30: 7 [IU] via SUBCUTANEOUS
  Administered 2016-03-30: 15 [IU] via SUBCUTANEOUS

## 2016-03-29 NOTE — Progress Notes (Addendum)
Subjective: 1 Day Post-Op Procedure(s) (LRB): ANTERIOR APPROACH HEMI HIP ARTHROPLASTY (Left) Patient reports pain as mild.  "My hip is sore".  Sitting in bed eating her meal.  Objective: Vital signs in last 24 hours: Temp:  [98.1 F (36.7 C)-100.6 F (38.1 C)] 98.9 F (37.2 C) (08/20 1431) Pulse Rate:  [76-112] 112 (08/20 1431) Resp:  [16-18] 17 (08/20 1431) BP: (136-178)/(47-66) 145/49 (08/20 1431) SpO2:  [90 %-99 %] 98 % (08/20 1431)  Intake/Output from previous day: 08/19 0701 - 08/20 0700 In: 3246.5 [P.O.:180; I.V.:3066.5] Out: 2005 [Urine:1805; Blood:200] Intake/Output this shift: Total I/O In: 770 [P.O.:720; IV Piggyback:50] Out: 2000 [Urine:2000]   Recent Labs  03/28/16 0228 03/29/16 0443  HGB 15.3* 13.0    Recent Labs  03/28/16 0228 03/29/16 0443  WBC 23.1* 10.7*  RBC 5.08 4.34  HCT 42.8 38.1  PLT 538* 400    Recent Labs  03/28/16 0228 03/29/16 0441  NA 134* 135  K 3.8 3.9  CL 100* 103  CO2 24 23  BUN 30* 14  CREATININE 0.84 0.81  GLUCOSE 364* 298*  CALCIUM 9.4 8.2*    Recent Labs  03/28/16 0551  INR 1.01   Left hip exam: Neurovascular intact Sensation intact distally Intact pulses distally Dorsiflexion/Plantar flexion intact Incision: dressing C/D/I Compartment soft  Assessment/Plan: 1 Day Post-Op Procedure(s) (LRB): ANTERIOR APPROACH HEMI HIP ARTHROPLASTY (Left)  Plan: Up with therapy Discharge to SNF in 1-2 days Weightbearing as tolerated on left with no hip precautions. Aspirin 325 mg twice daily with SCDs for DVT prophylaxis. Will follow.  [place] G 03/29/2016, 6:09 PM

## 2016-03-29 NOTE — Clinical Social Work Note (Signed)
Clinical Social Work Assessment  Patient Details  Name: Christine [name] MRN: [id number] Date of Birth: [birth year]  Date of referral:  03/29/16               Reason for consult:  Facility Placement                Permission sought to share information with:  Chartered certified accountant granted to share information::  Yes, Verbal Permission Granted  Name::        Agency::  [facility]  Relationship::     Contact Information:     Housing/Transportation Living arrangements for the past 2 months:  [place] of Information:  Patient Patient Interpreter Needed:  None Criminal Activity/Legal Involvement Pertinent to Current Situation/Hospitalization:  No - Comment as needed Significant Relationships:  Adult Children Lives with:  Self Do you feel safe going back to the place where you live?  No Need for family participation in patient care:  No (Coment)  Care giving concerns:  Pt lives in independent living at [facility]- currently needing higher level of assistance than normal.   Facilities manager / plan:  CSW spoke with pt concerning PT recommendation for pt to go to rehab portion of [facility] [place] for recovery.  Pt familiar with [facility] rehab and has no questions regarding the care there.  Employment status:  Retired Nurse, adult PT Recommendations:  [place] / Referral to community resources:  [place]  Patient/Family's Response to care:  Pt agreeable to short stay in rehab portion of [place].  Patient/Family's Understanding of and Emotional Response to Diagnosis, Current Treatment, and Prognosis: Pt has good understanding of current care plan and is hopeful she will recovery quickly and return to living independently.  Emotional Assessment Appearance:  Appears younger than stated age Attitude/Demeanor/Rapport:    Affect (typically observed):   Appropriate Orientation:  Oriented to Self, Oriented to Place, Oriented to  Time, Oriented to Situation Alcohol / Substance use:  Not Applicable Psych involvement (Current and /or in the community):  No (Comment)  Discharge Needs  Concerns to be addressed:  Care Coordination Readmission within the last 30 days:  No Current discharge risk:  Physical Impairment Barriers to Discharge:  Continued Medical Work up   Jorge Ny, LCSW 03/29/2016, 2:56 PM

## 2016-03-29 NOTE — Evaluation (Signed)
Occupational Therapy Evaluation Patient Details Name: Christine [name] MRN: [id number] DOB: [birth year] Today's Date: 03/29/2016    History of Present Illness This 82 year old female sustained a fall resulting in L femoral neck fx. She is s/p DA hemiarthroplasty   Clinical Impression   Pt was admitted for the above. At baseline, she is mod I with adls.  She currently needs +2 min to mod assistance for mobility and up to max A for LB adls. Goals in acute are for min A    Follow Up Recommendations  SNF    Equipment Recommendations   (to be further assessed)    Recommendations for Other Services       Precautions / Restrictions Precautions Precautions: Fall Restrictions LLE Weight Bearing: Weight bearing as tolerated      Mobility Bed Mobility Overal bed mobility: Needs Assistance Bed Mobility: Supine to Sit     Supine to sit: Min assist     General bed mobility comments: assist for LLE and cues for sequence.  Pt pulled up on bedrail  Transfers Overall transfer level: Needs assistance Equipment used: Rolling walker (2 wheeled) Transfers: Sit to/from Stand Sit to Stand: Mod assist;+2 physical assistance         General transfer comment: assist to rise and stabilize from elevated bed.  Pt moves quickly and needs multimodal cues due to [facility] when sitting to control descent.  Cues for UE/LE placement    Balance                                            ADL Overall ADL's : Needs assistance/impaired     Grooming: Set up;Sitting   Upper Body Bathing: Set up;Sitting   Lower Body Bathing: Moderate assistance;Sit to/from stand;+2 for physical assistance   Upper Body Dressing : Minimal assistance;Sitting (IV)   Lower Body Dressing: Maximal assistance;Sit to/from stand;+2 for physical assistance   Toilet Transfer: Moderate assistance;Ambulation;RW;+2 for safety/equipment (chair)             General ADL Comments: Pt ambulated out into  hallway (see PT note).  She needed mod  A +2 for sit to stand from elevated bed. She would benefit from AE but did not complete education on this visit.  Pt is very HOH and moves quickly so needs multimodal cues     Vision     Perception     Praxis      Pertinent Vitals/Pain Pain Assessment: Faces Faces Pain Scale: Hurts even more (initially when standing) Pain Location: L hip Pain Intervention(s): Limited activity within patient's tolerance;Monitored during session;Repositioned;RN gave pain meds during session;Ice applied     Hand Dominance     Extremity/Trunk Assessment Upper Extremity Assessment Upper Extremity Assessment: Overall WFL for tasks assessed           Communication Communication Communication: HOH (wears hearing aides but they don't help much)   Cognition Arousal/Alertness: Awake/alert Behavior During Therapy: WFL for tasks assessed/performed Overall Cognitive Status: Within Functional Limits for tasks assessed                     General Comments       Exercises       Shoulder Instructions      Home Living Family/patient expects to be discharged to:: Skilled nursing facility  Additional Comments: Pt is from independent living at [facility]. She does go down for meals.  Uses a cane at baseline      Prior Functioning/Environment Level of Independence: Independent with assistive device(s)             OT Diagnosis: Acute pain   OT Problem List: Decreased strength;Decreased activity tolerance;Decreased safety awareness;Decreased knowledge of use of DME or AE;Pain   OT Treatment/Interventions: Self-care/ADL training;DME and/or AE instruction;Patient/family education    OT Goals(Current goals can be found in the care plan section) Acute Rehab OT Goals Patient Stated Goal: return to independence OT Goal Formulation: With patient Time For Goal Achievement: 04/05/16 Potential to Achieve  Goals: Good ADL Goals Pt Will Perform Grooming: with min guard assist;standing Pt Will Perform Lower Body Bathing: with min assist;with adaptive equipment;sit to/from stand Pt Will Perform Lower Body Dressing: with mod assist;with adaptive equipment;sit to/from stand Pt Will Transfer to Toilet: with min assist;ambulating;bedside commode Pt Will Perform Toileting - Clothing Manipulation and hygiene: with min assist;sit to/from stand  OT Frequency: Min 2X/week   Barriers to D/C:            Co-evaluation PT/OT/SLP Co-Evaluation/Treatment: Yes Reason for Co-Treatment: For patient/therapist safety PT goals addressed during session: Mobility/safety with mobility OT goals addressed during session: ADL's and self-care      End of Session    Activity Tolerance: Patient tolerated treatment well Patient left: in chair;with call bell/phone within reach;with family/visitor present   Time: [id number] OT Time Calculation (min): 25 min Charges:  OT General Charges $OT Visit: 1 Procedure OT Evaluation $OT Eval Low Complexity: 1 Procedure G-Codes:    Daisa Stennis 2016-04-01, 12:10 PM  Lesle Chris, OTR/L [phone] 04-01-16

## 2016-03-29 NOTE — Evaluation (Signed)
Physical Therapy Evaluation Patient Details Name: Christine [name] MRN: [id number] DOB: 10/01/1921 Today's Date: 03/29/2016   History of Present Illness  This 82 year old female sustained a fall resulting in L femoral neck fx. She is s/p DA hemiarthroplasty  Clinical Impression  Pt s/p L hip hemi-arthroplasty through direct anterior approach and presents with functional mobility limitations 2* decreased L LE strength/ROM and post op pain.  Pt would benefit from follow up rehab at SNF level to maximize IND and safety prior to return to IND living setting.    Follow Up Recommendations SNF    Equipment Recommendations  None recommended by PT    Recommendations for Other Services OT consult     Precautions / Restrictions Precautions Precautions: Fall Restrictions Weight Bearing Restrictions: No LLE Weight Bearing: Weight bearing as tolerated      Mobility  Bed Mobility Overal bed mobility: Needs Assistance Bed Mobility: Supine to Sit     Supine to sit: Min assist     General bed mobility comments: assist for LLE and cues for sequence.  Pt pulled up on bedrail  Transfers Overall transfer level: Needs assistance Equipment used: Rolling walker (2 wheeled) Transfers: Sit to/from Stand Sit to Stand: Mod assist;+2 physical assistance         General transfer comment: assist to rise and stabilize from elevated bed.  Pt moves quickly and needs multimodal cues due to [facility] when sitting to control descent.  Cues for UE/LE placement  Ambulation/Gait Ambulation/Gait assistance: Min assist;Mod assist;+2 safety/equipment Ambulation Distance (Feet): 22 Feet Assistive device: Rolling walker (2 wheeled) Gait Pattern/deviations: Step-to pattern;Decreased step length - right;Decreased step length - left;Shuffle;Trunk flexed Gait velocity: decr   General Gait Details: cues for posture and position from [employer]            Wheelchair Mobility    Modified Rankin (Stroke  Patients Only)       Balance                                             Pertinent Vitals/Pain Pain Assessment: Faces Faces Pain Scale: Hurts even more Pain Location: L hip Pain Descriptors / Indicators: Aching;Sore Pain Intervention(s): Limited activity within patient's tolerance;Monitored during session;Ice applied;Patient requesting pain meds-RN notified    Home Living Family/patient expects to be discharged to:: Skilled nursing facility                 Additional Comments: Pt is from independent living at [facility]. She does go down for meals.  Uses a cane at baseline    Prior Function Level of Independence: Independent with assistive device(s)               Hand Dominance        Extremity/Trunk Assessment   Upper Extremity Assessment: Overall WFL for tasks assessed           Lower Extremity Assessment: LLE deficits/detail      Cervical / Trunk Assessment: Kyphotic  Communication   Communication: HOH  Cognition Arousal/Alertness: Awake/alert Behavior During Therapy: WFL for tasks assessed/performed Overall Cognitive Status: Within Functional Limits for tasks assessed                      General Comments      Exercises        Assessment/Plan    PT  Assessment Patient needs continued PT services  PT Diagnosis Difficulty walking   PT Problem List Decreased strength;Decreased range of motion;Decreased activity tolerance;Decreased mobility;Decreased knowledge of use of DME;Pain;Decreased safety awareness  PT Treatment Interventions DME instruction;Gait training;Stair training;Functional mobility training;Therapeutic activities;Therapeutic exercise;Patient/family education   PT Goals (Current goals can be found in the Care Plan section) Acute Rehab PT Goals Patient Stated Goal: return to independence PT Goal Formulation: With patient Time For Goal Achievement: 04/04/16 Potential to Achieve Goals: Good     Frequency Min 4X/week   Barriers to discharge        Co-evaluation PT/OT/SLP Co-Evaluation/Treatment: Yes Reason for Co-Treatment: For patient/therapist safety PT goals addressed during session: Mobility/safety with mobility OT goals addressed during session: ADL's and self-care       End of Session Equipment Utilized During Treatment: Gait belt Activity Tolerance: Patient tolerated treatment well;Patient limited by fatigue Patient left: in chair;with call bell/phone within reach;with chair alarm set;with family/visitor present Nurse Communication: Mobility status         Time: [id number] PT Time Calculation (min) (ACUTE ONLY): 25 min   Charges:   PT Evaluation $PT Eval Low Complexity: 1 Procedure     PT G Codes:        Christine [name] 04/19/16, 12:59 PM

## 2016-03-29 NOTE — Progress Notes (Signed)
PROGRESS NOTE    BAILEI VANDERMOLEN  [id number] DOB: January 13, 1922 DOA: 03/28/2016 PCP: No primary care provider on file.    Brief Narrative: Chasitity Sax Fentressis a 82 y.o.femalewith medical history significant for osteoporosis and chronic osteoarthritis managed with as needed Tylenol who presents from her independent living facility with severe left hip pain following a ground-level mechanical fall.  Assessment & Plan:   Principal Problem:   Femoral neck fracture, left, closed, initial encounter Active Problems:   UTI (urinary tract infection)   Hypertension, secondary   Hyponatremia   Leukocytosis   Thrombocytosis (HCC)   Type II diabetes mellitus (HCC)   Hip fracture (HCC)   Left femoral neck fracture: s/p hemiarthroplasty.  Pain control and physical therapy eval recommending SNF.   Uti: urine cultures show gram negative rods , continue with rocephin.    Hypertension:  Better controlled today.    Fever overnight: improving leukocytosis.  Resume rocephin.    Hyponatremia:  Resolved.   Type 2 Diabetes mellitus:  CBG (last 3)   Recent Labs  03/28/16 2215 03/29/16 0828 03/29/16 1155  GLUCAP 275* 247* 348*   Resume SSI.  Get HGBA1C .     DVT prophylaxis: (Lovenox/) Code Status: (Full/) Family Communication: none at bedside.  Disposition Plan: SNF in 1 to 2 days.    Consultants:   Orthopedics.   Procedures: hemiarthroplasty.   Antimicrobials: rocephin.    Subjective: Pain better controlled.  Objective: Vitals:   03/28/16 2306 03/29/16 0214 03/29/16 0625 03/29/16 1431  BP: (!) 136/50 (!) 178/66 (!) 170/60 (!) 145/49  Pulse: 99 (!) 103 (!) 108 (!) 112  Resp: 16 16 18 17   Temp: (!) 100.6 F (38.1 C) 100.1 F (37.8 C) 100 F (37.8 C) 98.9 F (37.2 C)  TempSrc: Oral Oral Oral Oral  SpO2: 94% 90% 92% 98%  Weight:      Height:        Intake/Output Summary (Last 24 hours) at 03/29/16 1444 Last data filed at 03/29/16 1434  Gross per 24  hour  Intake           2991.5 ml  Output             2405 ml  Net            586.5 ml   Filed Weights   03/28/16 0401  Weight: 60.5 kg (133 lb 6.1 oz)    Examination:  General exam: Appears calm and comfortable  Respiratory system: Clear to auscultation. Respiratory effort normal. Cardiovascular system: S1 & S2 heard, RRR. No JVD, murmurs, rubs, gallops or clicks. No pedal edema. Gastrointestinal system: Abdomen is nondistended, soft and nontender. No organomegaly or masses felt. Normal bowel sounds heard. Central nervous system: Alert and oriented. No focal neurological deficits. Extremities: painful RM of the left lower extremity. Skin: No rashes, lesions or ulcers Psychiatry: Judgement and insight appear normal. Mood & affect appropriate.     Data Reviewed: I have personally reviewed following labs and imaging studies  CBC:  Recent Labs Lab 03/28/16 0228 03/29/16 0443  WBC 23.1* 10.7*  NEUTROABS 20.4*  --   HGB 15.3* 13.0  HCT 42.8 38.1  MCV 84.3 87.8  PLT 538* [id number]   Basic Metabolic Panel:  Recent Labs Lab 03/28/16 0228 03/29/16 0441  NA 134* 135  K 3.8 3.9  CL 100* 103  CO2 24 23  GLUCOSE 364* 298*  BUN 30* 14  CREATININE 0.84 0.81  CALCIUM 9.4 8.2*   GFR: Estimated  Creatinine Clearance: 37.4 mL/min (by C-G formula based on SCr of 0.81 mg/dL). Liver Function Tests: No results for input(s): AST, ALT, ALKPHOS, BILITOT, PROT, ALBUMIN in the last 168 hours. No results for input(s): LIPASE, AMYLASE in the last 168 hours. No results for input(s): AMMONIA in the last 168 hours. Coagulation Profile:  Recent Labs Lab 03/28/16 0551  INR 1.01   Cardiac Enzymes:  Recent Labs Lab 03/28/16 0448  TROPONINI <0.03   BNP (last 3 results) No results for input(s): PROBNP in the last 8760 hours. HbA1C: No results for input(s): HGBA1C in the last 72 hours. CBG:  Recent Labs Lab 03/28/16 1621 03/28/16 1743 03/28/16 2215 03/29/16 0828 03/29/16 1155    GLUCAP 190* 217* 275* 247* 348*   Lipid Profile: No results for input(s): CHOL, HDL, LDLCALC, TRIG, CHOLHDL, LDLDIRECT in the last 72 hours. Thyroid Function Tests: No results for input(s): TSH, T4TOTAL, FREET4, T3FREE, THYROIDAB in the last 72 hours. Anemia Panel: No results for input(s): VITAMINB12, FOLATE, FERRITIN, TIBC, IRON, RETICCTPCT in the last 72 hours. Sepsis Labs: No results for input(s): PROCALCITON, LATICACIDVEN in the last 168 hours.  Recent Results (from the past 240 hour(s))  Culture, Urine     Status: Abnormal (Preliminary result)   Collection Time: 03/28/16  3:43 AM  Result Value Ref Range Status   Specimen Description URINE, RANDOM  Final   Special Requests NONE  Final   Culture >=100,000 COLONIES/mL GRAM NEGATIVE RODS (A)  Final   Report Status PENDING  Incomplete  Surgical PCR screen     Status: None   Collection Time: 03/28/16  9:05 AM  Result Value Ref Range Status   MRSA, PCR NEGATIVE NEGATIVE Final   Staphylococcus aureus NEGATIVE NEGATIVE Final    Comment:        The Xpert SA Assay (FDA approved for NASAL specimens in patients over 25 years of age), is one component of a comprehensive surveillance program.  Test performance has been validated by [facility] for patients greater than or equal to 74 year old. It is not intended to diagnose infection nor to guide or monitor treatment.          Radiology Studies: Dg Chest 1 View  Result Date: 03/28/2016 CLINICAL DATA:  Preoperative chest radiograph for left hip fracture. Initial encounter. EXAM: CHEST 1 VIEW COMPARISON:  Chest radiograph performed 02/25/2011 FINDINGS: The lungs are well-aerated and clear. There is no evidence of focal opacification, pleural effusion or pneumothorax. The heart is normal in size; the mediastinal contour is within normal limits. No acute osseous abnormalities are seen. IMPRESSION: No acute cardiopulmonary process seen. No displaced rib fractures identified.  Electronically Signed   By: Garald Balding M.D.   On: 03/28/2016 03:18   Ct Hip Left Wo Contrast  Result Date: 03/28/2016 CLINICAL DATA:  Hip pain after fall. EXAM: CT OF THE LEFT HIP WITHOUT CONTRAST TECHNIQUE: Multidetector CT imaging of the left hip was performed according to the standard protocol. Multiplanar CT image reconstructions were also generated. COMPARISON:  Pelvis radiography from earlier today FINDINGS: Bones/Joint/Cartilage Left femoral neck fracture with mild subcapital impaction laterally. No intertrochanteric or trochanteric involvement. Hip osteoarthritis with mild to moderate acetabular spurring. Ligaments Suboptimally assessed by CT. Muscles and Tendons No gross myotendinous disruption. Soft tissues Sigmoid diverticulosis.  Suspension anchors on the pubic bodies. IMPRESSION: 1. Mildly impacted left femoral neck fracture. 2. Acetabular spurring. Electronically Signed   By: Monte Fantasia M.D.   On: 03/28/2016 01:57   Dg C-arm 1-60  Min-no Report  Result Date: 03/28/2016 CLINICAL DATA:  Postop left hip replacement EXAM: DG C-ARM 1-60 MIN-NO REPORT; OPERATIVE LEFT HIP WITH PELVIS COMPARISON:  03/28/2016 FINDINGS: Single frontal view of the left hip submitted. There is a prosthesis in left proximal femur with anatomic alignment with left acetabulum. IMPRESSION: Prosthesis in left proximal femur with anatomic alignment. Fluoroscopy time was 10 seconds. Please see the operative report. Patient's dose 0.7 mGy. Electronically Signed   By: Lahoma Crocker M.D.   On: 03/28/2016 16:42   Dg Hip Operative Unilat W Or W/o Pelvis Left  Result Date: 03/28/2016 CLINICAL DATA:  Postop left hip replacement EXAM: DG C-ARM 1-60 MIN-NO REPORT; OPERATIVE LEFT HIP WITH PELVIS COMPARISON:  03/28/2016 FINDINGS: Single frontal view of the left hip submitted. There is a prosthesis in left proximal femur with anatomic alignment with left acetabulum. IMPRESSION: Prosthesis in left proximal femur with anatomic  alignment. Fluoroscopy time was 10 seconds. Please see the operative report. Patient's dose 0.7 mGy. Electronically Signed   By: Lahoma Crocker M.D.   On: 03/28/2016 16:42   Dg Hip Unilat W Or Wo Pelvis 2-3 Views Left  Result Date: 03/28/2016 CLINICAL DATA:  Left hip pain following fall from toilet. EXAM: DG HIP (WITH OR WITHOUT PELVIS) 2-3V LEFT COMPARISON:  Right hip radiograph 03/11/2016 FINDINGS: There is severe narrowing of the left hip joint space with associated subchondral sclerosis and lateral acetabular osteophyte formation. Mild flattening of the left femoral head. No visible fracture or dislocation. There is a right hip total arthroplasty in anatomic alignment without adverse features. Visualized soft tissues and pelvic bowel gas pattern are normal. IMPRESSION: 1. No radiographically evident acute fracture or dislocation of the left hip. In the setting of trauma, MRI may be helpful for the detection of nondisplaced radiooccult hip fracture in a patient of this age, if clinically indicated. 2. Moderate left hip osteoarthrosis. 3. Right total hip arthroplasty without adverse features. Electronically Signed   By: Ulyses Jarred M.D.   On: 03/28/2016 01:11        Scheduled Meds: . aspirin EC  325 mg Oral BID WC  . docusate sodium  100 mg Oral BID  . insulin aspart  0-9 Units Subcutaneous TID WC  . senna  1 tablet Oral BID   Continuous Infusions: . sodium chloride 50 mL/hr at 03/29/16 0850     LOS: 1 day    Time spent: 25 minutes    Ravi Tuccillo, MD [facility] Pager [phone] If 7PM-7AM, please contact night-coverage [email or web address] Password [facility] 03/29/2016, 2:44 PM

## 2016-03-29 NOTE — NC FL2 (Signed)
[place] LEVEL OF CARE SCREENING TOOL     IDENTIFICATION  Patient Name: Christine [name] Birthdate: [birth year] Sex: female Admission Date (Current Location): 03/28/2016  [facility] and [place] Number:  Herbalist and Address:  [facility],  [place] [address] [place]      Provider Number: [id number]  Attending Physician Name and Address:  Hosie Poisson, MD  Relative Name and Phone Number:       Current Level of Care: Hospital Recommended Level of Care: [place] Prior Approval Number:    Date Approved/Denied:   PASRR Number: [id number]  Discharge Plan: SNF    Current Diagnoses: Patient Active Problem List   Diagnosis Date Noted  . Femoral neck fracture, left, closed, initial encounter 03/28/2016  . UTI (urinary tract infection) 03/28/2016  . Hypertension, secondary 03/28/2016  . Hyponatremia 03/28/2016  . Leukocytosis 03/28/2016  . Thrombocytosis ([place]) 03/28/2016  . Type II diabetes mellitus ([place]) 03/28/2016  . Hip fracture ([place]) 03/28/2016    Orientation RESPIRATION BLADDER Height & Weight     Self, Time, Situation, Place  O2 (2.5L [place]) Continent Weight: 60.5 kg (133 lb 6.1 oz) Height:  5\' 3"  (160 cm)  BEHAVIORAL SYMPTOMS/MOOD NEUROLOGICAL BOWEL NUTRITION STATUS      Continent    AMBULATORY STATUS COMMUNICATION OF NEEDS Skin   Limited Assist Verbally Surgical wounds                       Personal Care Assistance Level of Assistance  Bathing, Dressing Bathing Assistance: Limited assistance   Dressing Assistance: Limited assistance     Functional Limitations Info             SPECIAL CARE FACTORS FREQUENCY  PT (By licensed PT), OT (By licensed OT)     PT Frequency: 5/wk OT Frequency: 5/wk            Contractures      Additional Factors Info  Code Status, Allergies, Insulin Sliding Scale Code Status Info: FULL Allergies Info: NKA   Insulin Sliding Scale Info: 3/day        Current Medications (03/29/2016):  This is the current hospital active medication list Current Facility-Administered Medications  Medication Dose Route Frequency Provider Last Rate Last Dose  . 0.9 %  sodium chloride infusion   Intravenous Continuous Gary Fleet, PA-C 50 mL/hr at 03/29/16 0850    . acetaminophen (TYLENOL) tablet 650 mg  650 mg Oral Q6H PRN Gary Fleet, PA-C       Or  . acetaminophen (TYLENOL) suppository 650 mg  650 mg Rectal Q6H PRN Gary Fleet, PA-C      . alum & mag hydroxide-simeth (MAALOX/MYLANTA) 200-200-20 MG/5ML suspension 30 mL  30 mL Oral Q4H PRN Gary Fleet, PA-C      . aspirin EC tablet 325 mg  325 mg Oral BID WC Gary Fleet, PA-C   325 mg at 03/29/16 0848  . bisacodyl (DULCOLAX) EC tablet 5 mg  5 mg Oral Daily PRN Gary Fleet, PA-C      . docusate sodium (COLACE) capsule 100 mg  100 mg Oral BID Gary Fleet, PA-C   100 mg at 03/29/16 1031  . hydrALAZINE (APRESOLINE) injection 10 mg  10 mg Intravenous Q4H PRN Vianne Bulls, MD   10 mg at 03/28/16 0440  . HYDROcodone-acetaminophen (NORCO/VICODIN) 5-325 MG per tablet 1 tablet  1 tablet Oral Q6H PRN Gary Fleet, PA-C   1 tablet at 03/28/16  2203  . HYDROmorphone (DILAUDID) injection 0.1 mg  0.1 mg Intravenous Q2H PRN Gary Fleet, PA-C      . insulin aspart (novoLOG) injection 0-9 Units  0-9 Units Subcutaneous TID WC Hosie Poisson, MD   7 Units at 03/29/16 1209  . methocarbamol (ROBAXIN) tablet 500 mg  500 mg Oral Q6H PRN Vianne Bulls, MD   500 mg at 03/29/16 1036   Or  . methocarbamol (ROBAXIN) 500 mg in dextrose 5 % 50 mL IVPB  500 mg Intravenous Q6H PRN Vianne Bulls, MD      . ondansetron (ZOFRAN) tablet 4 mg  4 mg Oral Q6H PRN Gary Fleet, PA-C       Or  . ondansetron [facility]) injection 4 mg  4 mg Intravenous Q6H PRN Gary Fleet, PA-C      . polyethylene glycol (MIRALAX / GLYCOLAX) packet 17 g  17 g Oral Daily PRN Gary Fleet, PA-C      . senna (SENOKOT) tablet 8.6 mg  1 tablet Oral BID  Vianne Bulls, MD   8.6 mg at 03/29/16 1031     Discharge Medications: Please see discharge summary for a list of discharge medications.  Relevant Imaging Results:  Relevant Lab Results:   Additional Information SS#: [id number]  Jorge Ny, LCSW

## 2016-03-30 ENCOUNTER — Encounter (HOSPITAL_COMMUNITY): Payer: Self-pay | Admitting: Orthopedic Surgery

## 2016-03-30 LAB — CBC
HCT: 35.4 % — ABNORMAL LOW (ref 36.0–46.0)
Hemoglobin: 12.1 g/dL (ref 12.0–15.0)
MCH: 29.2 pg (ref 26.0–34.0)
MCHC: 34.2 g/dL (ref 30.0–36.0)
MCV: 85.3 fL (ref 78.0–100.0)
Platelets: 372 K/uL (ref 150–400)
RBC: 4.15 MIL/uL (ref 3.87–5.11)
RDW: 13.3 % (ref 11.5–15.5)
WBC: 11.2 K/uL — ABNORMAL HIGH (ref 4.0–10.5)

## 2016-03-30 LAB — GLUCOSE, CAPILLARY
Glucose-Capillary: 244 mg/dL — ABNORMAL HIGH (ref 65–99)
Glucose-Capillary: 325 mg/dL — ABNORMAL HIGH (ref 65–99)

## 2016-03-30 LAB — URINE CULTURE

## 2016-03-30 LAB — VITAMIN D 25 HYDROXY (VIT D DEFICIENCY, FRACTURES): VIT D 25 HYDROXY: 23.5 ng/mL — AB (ref 30.0–100.0)

## 2016-03-30 MED ORDER — SENNA 8.6 MG PO TABS: 1.0000 | ORAL_TABLET | Freq: Two times a day (BID) | ORAL | 0 refills | Status: DC

## 2016-03-30 MED ORDER — CIPROFLOXACIN HCL 500 MG PO TABS: 500.0000 mg | ORAL_TABLET | Freq: Every day | ORAL | 0 refills | Status: DC

## 2016-03-30 MED ORDER — INSULIN ASPART 100 UNIT/ML ~~LOC~~ SOLN: SUBCUTANEOUS | 11 refills | Status: DC

## 2016-03-30 MED ORDER — INSULIN GLARGINE 100 UNIT/ML ~~LOC~~ SOLN
6.0000 [IU] | Freq: Every day | SUBCUTANEOUS | Status: DC
  Administered 2016-03-30: 6 [IU] via SUBCUTANEOUS
  Filled 2016-03-30: qty 0.06

## 2016-03-30 MED ORDER — DOCUSATE SODIUM 100 MG PO CAPS: 100.0000 mg | ORAL_CAPSULE | Freq: Two times a day (BID) | ORAL | 0 refills | Status: DC

## 2016-03-30 MED ORDER — INSULIN GLARGINE 100 UNIT/ML ~~LOC~~ SOLN: 6.0000 [IU] | Freq: Every day | SUBCUTANEOUS | 11 refills | Status: DC

## 2016-03-30 NOTE — Discharge Summary (Signed)
Physician Discharge Summary  Christine [name] [id number] DOB: 03-Mar-1922 DOA: 03/28/2016  PCP: No primary care provider on file.  Admit date: 03/28/2016 Discharge date: 03/30/2016  Admitted From: [facility].  Disposition:  SNF.  Recommendations for Outpatient Follow-up:  1. Follow up with PCP in 1-2 weeks 2. Please obtain BMP/CBC in one week 3. Follow up with orthopedics as recommended.     Discharge Condition:stable. CODE STATUS:full code.  Diet recommendation: Heart Healthy / Brief/Interim Summary: Christine T Fentressis a 82 y.o.femalewith medical history significant for osteoporosis and chronic osteoarthritis managed with as needed Tylenol who presents from her independent living facility with severe left hip pain following a ground-level mechanical fall. she was found to have left femoral neck fracture underwent hemiarthroplasty and PT recommended snf on discharge. Meanwhile she was found to have klebsiella, and discharged on oral ciprofloxacin.  Discharge Diagnoses:  Principal Problem:   Femoral neck fracture, left, closed, initial encounter Active Problems:   UTI (urinary tract infection)   Hypertension, secondary   Hyponatremia   Leukocytosis   Thrombocytosis (HCC)   Type II diabetes mellitus (HCC)   Hip fracture (HCC)  Left femoral neck fracture: s/p hemiarthroplasty.  Pain control and physical therapy eval recommending SNF.   Uti: urine cultures show klebsiella. Continue with cipro to complete the course.     Hypertension:  Better controlled today.    Fever resolved.  Afebrile last 24 hours.  Will be discharged on oral ciprofloxacin to complete the course.    Hyponatremia:  Resolved.   Type 2 Diabetes mellitus:  CBG (last 3)  not well controlled. Started on lantus today and continue on discharge.   Recent Labs (last 2 labs)    Recent Labs  03/28/16 2215 03/29/16 0828 03/29/16 1155  GLUCAP 275* 247* 348*     hgba1c is 13 and started her  on resistant scale SSI.  Monitor cbgs with every meal.   Discharge Instructions  Discharge Instructions    Diet - low sodium heart healthy    Complete by:  As directed   Discharge instructions    Complete by:  As directed   Please follow up with orthopedics as recommended.  Please follow up with PCP in one week.   Weight bearing as tolerated    Complete by:  As directed   Laterality:  left   Extremity:  Lower       Medication List    TAKE these medications   acetaminophen 500 MG tablet Commonly known as:  TYLENOL Take 500 mg by mouth every 6 (six) hours as needed for mild pain or headache.   aspirin EC 325 MG tablet Take 1 tablet (325 mg total) by mouth 2 (two) times daily after a meal. Take x 1 month post op to decrease risk of blood clots.   ciprofloxacin 500 MG tablet Commonly known as:  CIPRO Take 1 tablet (500 mg total) by mouth daily with breakfast.   docusate sodium 100 MG capsule Commonly known as:  COLACE Take 1 capsule (100 mg total) by mouth 2 (two) times daily.   HYDROcodone-acetaminophen 5-325 MG tablet Commonly known as:  NORCO Take 1 tablet by mouth every 6 (six) hours as needed for severe pain.   PRESCRIPTION MEDICATION Take 1 tablet by mouth 2 (two) times daily. Per patients son she is on a medication for diabetes but he doesn't know what it is,neither of them know her pharmacy and the nursing home will not answer the phone.   senna 8.6 MG  Tabs tablet Commonly known as:  SENOKOT Take 1 tablet (8.6 mg total) by mouth 2 (two) times daily.      Follow-up Information    GRAVES,JOHN L, MD. Schedule an appointment as soon as possible for a visit in 2 week(s).   Specialty:  Orthopedic Surgery Contact information: [place] [address] [phone]          No Known Allergies  Consultations: Orthopedics.  Procedures/Studies: Dg Chest 1 View  Result Date: 03/28/2016 CLINICAL DATA:  Preoperative chest radiograph for left hip  fracture. Initial encounter. EXAM: CHEST 1 VIEW COMPARISON:  Chest radiograph performed 02/25/2011 FINDINGS: The lungs are well-aerated and clear. There is no evidence of focal opacification, pleural effusion or pneumothorax. The heart is normal in size; the mediastinal contour is within normal limits. No acute osseous abnormalities are seen. IMPRESSION: No acute cardiopulmonary process seen. No displaced rib fractures identified. Electronically Signed   By: Garald Balding M.D.   On: 03/28/2016 03:18   Ct Hip Left Wo Contrast  Result Date: 03/28/2016 CLINICAL DATA:  Hip pain after fall. EXAM: CT OF THE LEFT HIP WITHOUT CONTRAST TECHNIQUE: Multidetector CT imaging of the left hip was performed according to the standard protocol. Multiplanar CT image reconstructions were also generated. COMPARISON:  Pelvis radiography from earlier today FINDINGS: Bones/Joint/Cartilage Left femoral neck fracture with mild subcapital impaction laterally. No intertrochanteric or trochanteric involvement. Hip osteoarthritis with mild to moderate acetabular spurring. Ligaments Suboptimally assessed by CT. Muscles and Tendons No gross myotendinous disruption. Soft tissues Sigmoid diverticulosis.  Suspension anchors on the pubic bodies. IMPRESSION: 1. Mildly impacted left femoral neck fracture. 2. Acetabular spurring. Electronically Signed   By: Monte Fantasia M.D.   On: 03/28/2016 01:57   Dg C-arm 1-60 Min-no Report  Result Date: 03/28/2016 CLINICAL DATA:  Postop left hip replacement EXAM: DG C-ARM 1-60 MIN-NO REPORT; OPERATIVE LEFT HIP WITH PELVIS COMPARISON:  03/28/2016 FINDINGS: Single frontal view of the left hip submitted. There is a prosthesis in left proximal femur with anatomic alignment with left acetabulum. IMPRESSION: Prosthesis in left proximal femur with anatomic alignment. Fluoroscopy time was 10 seconds. Please see the operative report. Patient's dose 0.7 mGy. Electronically Signed   By: Lahoma Crocker M.D.   On:  03/28/2016 16:42   Dg Hip Operative Unilat W Or W/o Pelvis Left  Result Date: 03/28/2016 CLINICAL DATA:  Postop left hip replacement EXAM: DG C-ARM 1-60 MIN-NO REPORT; OPERATIVE LEFT HIP WITH PELVIS COMPARISON:  03/28/2016 FINDINGS: Single frontal view of the left hip submitted. There is a prosthesis in left proximal femur with anatomic alignment with left acetabulum. IMPRESSION: Prosthesis in left proximal femur with anatomic alignment. Fluoroscopy time was 10 seconds. Please see the operative report. Patient's dose 0.7 mGy. Electronically Signed   By: Lahoma Crocker M.D.   On: 03/28/2016 16:42   Dg Hip Unilat W Or Wo Pelvis 2-3 Views Left  Result Date: 03/28/2016 CLINICAL DATA:  Left hip pain following fall from toilet. EXAM: DG HIP (WITH OR WITHOUT PELVIS) 2-3V LEFT COMPARISON:  Right hip radiograph 03/11/2016 FINDINGS: There is severe narrowing of the left hip joint space with associated subchondral sclerosis and lateral acetabular osteophyte formation. Mild flattening of the left femoral head. No visible fracture or dislocation. There is a right hip total arthroplasty in anatomic alignment without adverse features. Visualized soft tissues and pelvic bowel gas pattern are normal. IMPRESSION: 1. No radiographically evident acute fracture or dislocation of the left hip. In the setting of trauma,  MRI may be helpful for the detection of nondisplaced radiooccult hip fracture in a patient of this age, if clinically indicated. 2. Moderate left hip osteoarthrosis. 3. Right total hip arthroplasty without adverse features. Electronically Signed   By: Ulyses Jarred M.D.   On: 03/28/2016 01:11   Dg Hip Unilat With Pelvis 2-3 Views Right  Result Date: 03/11/2016 CLINICAL DATA:  82 year old who fell approximately 2-3 weeks ago and complains of persistent right hip pain. Remote prior right hip arthroplasty in the early 1990s. Initial encounter. EXAM: DG HIP (WITH OR WITHOUT PELVIS) 2-3V RIGHT COMPARISON:  None.  FINDINGS: No evidence of acute or subacute fracture. Right hip arthroplasty with anatomic alignment and no complicating features. Myositis ossificans in the gluteal muscles. Osseous demineralization. IMPRESSION: No acute or subacute osseous abnormality. Right hip arthroplasty with anatomic alignment and no complicating features. Osseous demineralization. Electronically Signed   By: Evangeline Dakin M.D.   On: 03/11/2016 13:41       Subjective: Pain better today.   Discharge Exam: Vitals:   03/29/16 2113 03/30/16 0625  BP: (!) 177/66 (!) 151/56  Pulse: (!) 114 (!) 102  Resp: 18 17  Temp: 99.9 F (37.7 C) 99.6 F (37.6 C)   Vitals:   03/29/16 0625 03/29/16 1431 03/29/16 2113 03/30/16 0625  BP: (!) 170/60 (!) 145/49 (!) 177/66 (!) 151/56  Pulse: (!) 108 (!) 112 (!) 114 (!) 102  Resp: 18 17 18 17   Temp: 100 F (37.8 C) 98.9 F (37.2 C) 99.9 F (37.7 C) 99.6 F (37.6 C)  TempSrc: Oral Oral Oral Oral  SpO2: 92% 98% 94% 93%  Weight:      Height:        General: Pt is alert, awake, not in acute distress Cardiovascular: RRR, S1/S2 +, no rubs, no gallops Respiratory: CTA bilaterally, no wheezing, no rhonchi Abdominal: Soft, NT, ND, bowel sounds + Extremities: no edema, no cyanosis    The results of significant diagnostics from this hospitalization (including imaging, microbiology, ancillary and laboratory) are listed below for reference.     Microbiology: Recent Results (from the past 240 hour(s))  Culture, Urine     Status: Abnormal   Collection Time: 03/28/16  3:43 AM  Result Value Ref Range Status   Specimen Description URINE, RANDOM  Final   Special Requests NONE  Final   Culture >=100,000 COLONIES/mL KLEBSIELLA OXYTOCA (A)  Final   Report Status 03/30/2016 FINAL  Final   Organism ID, Bacteria KLEBSIELLA OXYTOCA (A)  Final      Susceptibility   Klebsiella oxytoca - MIC*    AMPICILLIN >=32 RESISTANT Resistant     CEFAZOLIN 32 INTERMEDIATE Intermediate      CEFTRIAXONE <=1 SENSITIVE Sensitive     CIPROFLOXACIN <=0.25 SENSITIVE Sensitive     GENTAMICIN <=1 SENSITIVE Sensitive     IMIPENEM <=0.25 SENSITIVE Sensitive     NITROFURANTOIN 32 SENSITIVE Sensitive     TRIMETH/SULFA <=20 SENSITIVE Sensitive     AMPICILLIN/SULBACTAM 16 INTERMEDIATE Intermediate     PIP/TAZO <=4 SENSITIVE Sensitive     Extended ESBL NEGATIVE Sensitive     * >=100,000 COLONIES/mL KLEBSIELLA OXYTOCA  Surgical PCR screen     Status: None   Collection Time: 03/28/16  9:05 AM  Result Value Ref Range Status   MRSA, PCR NEGATIVE NEGATIVE Final   Staphylococcus aureus NEGATIVE NEGATIVE Final    Comment:        The Xpert SA Assay (FDA approved for NASAL specimens in patients over 21  years of age), is one component of a comprehensive surveillance program.  Test performance has been validated by [facility] for patients greater than or equal to 70 year old. It is not intended to diagnose infection nor to guide or monitor treatment.      Labs: BNP (last 3 results) No results for input(s): BNP in the last 8760 hours. Basic Metabolic Panel:  Recent Labs Lab 03/28/16 0228 03/29/16 0441  NA 134* 135  K 3.8 3.9  CL 100* 103  CO2 24 23  GLUCOSE 364* 298*  BUN 30* 14  CREATININE 0.84 0.81  CALCIUM 9.4 8.2*   Liver Function Tests: No results for input(s): AST, ALT, ALKPHOS, BILITOT, PROT, ALBUMIN in the last 168 hours. No results for input(s): LIPASE, AMYLASE in the last 168 hours. No results for input(s): AMMONIA in the last 168 hours. CBC:  Recent Labs Lab 03/28/16 0228 03/29/16 0443 03/30/16 0452  WBC 23.1* 10.7* 11.2*  NEUTROABS 20.4*  --   --   HGB 15.3* 13.0 12.1  HCT 42.8 38.1 35.4*  MCV 84.3 87.8 85.3  PLT 538* 400 372   Cardiac Enzymes:  Recent Labs Lab 03/28/16 0448  TROPONINI <0.03   BNP: Invalid input(s): POCBNP CBG:  Recent Labs Lab 03/29/16 0828 03/29/16 1155 03/29/16 1745 03/29/16 2109 03/30/16 0820  GLUCAP 247* 348*  341* 237* 244*   D-Dimer No results for input(s): DDIMER in the last 72 hours. Hgb A1c  Recent Labs  03/28/16 0446  HGBA1C 13.9*   Lipid Profile No results for input(s): CHOL, HDL, LDLCALC, TRIG, CHOLHDL, LDLDIRECT in the last 72 hours. Thyroid function studies No results for input(s): TSH, T4TOTAL, T3FREE, THYROIDAB in the last 72 hours.  Invalid input(s): FREET3 Anemia work up No results for input(s): VITAMINB12, FOLATE, FERRITIN, TIBC, IRON, RETICCTPCT in the last 72 hours. Urinalysis    Component Value Date/Time   COLORURINE YELLOW 03/28/2016 0343   APPEARANCEUR CLOUDY (A) 03/28/2016 0343   LABSPEC 1.024 03/28/2016 0343   PHURINE 5.5 03/28/2016 0343   GLUCOSEU >1000 (A) 03/28/2016 0343   HGBUR TRACE (A) 03/28/2016 0343   BILIRUBINUR NEGATIVE 03/28/2016 0343   KETONESUR NEGATIVE 03/28/2016 0343   PROTEINUR 30 (A) 03/28/2016 0343   UROBILINOGEN 0.2 02/25/2011 1252   NITRITE NEGATIVE 03/28/2016 0343   LEUKOCYTESUR TRACE (A) 03/28/2016 0343   Sepsis Labs Invalid input(s): PROCALCITONIN,  WBC,  LACTICIDVEN Microbiology Recent Results (from the past 240 hour(s))  Culture, Urine     Status: Abnormal   Collection Time: 03/28/16  3:43 AM  Result Value Ref Range Status   Specimen Description URINE, RANDOM  Final   Special Requests NONE  Final   Culture >=100,000 COLONIES/mL KLEBSIELLA OXYTOCA (A)  Final   Report Status 03/30/2016 FINAL  Final   Organism ID, Bacteria KLEBSIELLA OXYTOCA (A)  Final      Susceptibility   Klebsiella oxytoca - MIC*    AMPICILLIN >=32 RESISTANT Resistant     CEFAZOLIN 32 INTERMEDIATE Intermediate     CEFTRIAXONE <=1 SENSITIVE Sensitive     CIPROFLOXACIN <=0.25 SENSITIVE Sensitive     GENTAMICIN <=1 SENSITIVE Sensitive     IMIPENEM <=0.25 SENSITIVE Sensitive     NITROFURANTOIN 32 SENSITIVE Sensitive     TRIMETH/SULFA <=20 SENSITIVE Sensitive     AMPICILLIN/SULBACTAM 16 INTERMEDIATE Intermediate     PIP/TAZO <=4 SENSITIVE Sensitive      Extended ESBL NEGATIVE Sensitive     * >=100,000 COLONIES/mL KLEBSIELLA OXYTOCA  Surgical PCR screen  Status: None   Collection Time: 03/28/16  9:05 AM  Result Value Ref Range Status   MRSA, PCR NEGATIVE NEGATIVE Final   Staphylococcus aureus NEGATIVE NEGATIVE Final    Comment:        The Xpert SA Assay (FDA approved for NASAL specimens in patients over 47 years of age), is one component of a comprehensive surveillance program.  Test performance has been validated by [facility] for patients greater than or equal to 22 year old. It is not intended to diagnose infection nor to guide or monitor treatment.      Time coordinating discharge: Over 30 minutes  SIGNED:   Hosie Poisson, MD  [facility] 03/30/2016, 10:51 AM Pager   If 7PM-7AM, please contact night-coverage [email or web address] Password [email or web address]

## 2016-03-30 NOTE — Progress Notes (Signed)
Physical Therapy Treatment Patient Details Name: Christine [name] MRN: [id number] DOB: [birth year] Today's Date: 03/30/2016    History of Present Illness This 82 year old female sustained a fall resulting in L femoral neck fx. She is s/p DA hemiarthroplasty    PT Comments    POD # 2 am session Assisted pt OOB to [facility] then back to bed.  Increased time to position to comfort.   Follow Up Recommendations  SNF     Equipment Recommendations       Recommendations for Other Services       Precautions / Restrictions Precautions Precautions: Fall Precaution Comments: HOH Restrictions Weight Bearing Restrictions: No LLE Weight Bearing: Weight bearing as tolerated    Mobility  Bed Mobility Overal bed mobility: Needs Assistance Bed Mobility: Supine to Sit;Sit to Supine     Supine to sit: Min assist Sit to supine: Mod assist   General bed mobility comments: assist for LLE and cues for sequence.  increased assist back to bed  Transfers Overall transfer level: Needs assistance Equipment used: Rolling walker (2 wheeled) Transfers: Sit to/from [facility] Sit to Stand: Mod assist;+2 physical assistance         General transfer comment: assisted from elevated bed to [facility] then back to bed with increased time and VC's on safety with turn completion.    Ambulation/Gait             General Gait Details: transfers only this session   Stairs            Wheelchair Mobility    Modified Rankin (Stroke Patients Only)       Balance                                    Cognition Arousal/Alertness: Awake/alert Behavior During Therapy: WFL for tasks assessed/performed Overall Cognitive Status: Within Functional Limits for tasks assessed                      Exercises      General Comments        Pertinent Vitals/Pain Pain Assessment: Faces Pain Score: 4  Pain Location: L hip "when I move' Pain Descriptors /  Indicators: Aching;Sore Pain Intervention(s): Monitored during session;Repositioned    Home Living                      Prior Function            PT Goals (current goals can now be found in the care plan section) Progress towards PT goals: Progressing toward goals    Frequency  Min 4X/week    PT Plan Current plan remains appropriate    Co-evaluation             End of Session Equipment Utilized During Treatment: Gait belt Activity Tolerance: Patient tolerated treatment well;Patient limited by fatigue Patient left: in bed     Time: [id number] PT Time Calculation (min) (ACUTE ONLY): 19 min  Charges:  $Therapeutic Activity: 8-22 mins                    G Codes:      Rica Koyanagi  PTA [facility]  Acute  Rehab Pager      [phone]

## 2016-03-30 NOTE — Progress Notes (Signed)
Pt ready to return to [facility] today. Pt is in agreement with d/c plan. Unable to reach son after many attempts. Pt reports that we mostly will not reach him today. Pt will update son. [facility] transport required. Medical necessity form completed. D/C Summary sent to SNF for review. Scripts included in d/c packet. # for report provided to nsg.  Werner Lean LCSW [phone]

## 2016-03-31 LAB — HEMOGLOBIN A1C
HEMOGLOBIN A1C: 13.8 % — AB (ref 4.8–5.6)
MEAN PLASMA GLUCOSE: 349 mg/dL

## 2016-06-05 ENCOUNTER — Ambulatory Visit
Admission: RE | Admit: 2016-06-05 | Discharge: 2016-06-05 | Disposition: A | Discharge status: Home / Self Care | Payer: Medicare Other | Source: Ambulatory Visit | Attending: Internal Medicine | Admitting: Internal Medicine

## 2016-06-05 ENCOUNTER — Other Ambulatory Visit: Payer: Self-pay | Admitting: Internal Medicine

## 2016-06-05 DIAGNOSIS — M25551 Pain in right hip: Secondary | ICD-10-CM

## 2017-02-22 ENCOUNTER — Other Ambulatory Visit: Payer: Self-pay | Admitting: Geriatric Medicine

## 2017-02-22 ENCOUNTER — Ambulatory Visit
Admission: RE | Admit: 2017-02-22 | Discharge: 2017-02-22 | Disposition: A | Discharge status: Home / Self Care | Payer: Medicare Other | Source: Ambulatory Visit | Attending: Geriatric Medicine | Admitting: Geriatric Medicine

## 2017-02-22 DIAGNOSIS — R0609 Other forms of dyspnea: Principal | ICD-10-CM

## 2017-03-12 ENCOUNTER — Ambulatory Visit: Payer: Medicare Other | Admitting: Podiatry

## 2017-03-18 ENCOUNTER — Other Ambulatory Visit: Payer: Self-pay | Admitting: Geriatric Medicine

## 2017-03-18 DIAGNOSIS — I5021 Acute systolic (congestive) heart failure: Secondary | ICD-10-CM

## 2017-03-29 ENCOUNTER — Ambulatory Visit (INDEPENDENT_AMBULATORY_CARE_PROVIDER_SITE_OTHER): Payer: Medicare Other | Admitting: Internal Medicine

## 2017-03-29 ENCOUNTER — Encounter: Payer: Self-pay | Admitting: Internal Medicine

## 2017-03-29 ENCOUNTER — Ambulatory Visit (HOSPITAL_COMMUNITY): Payer: Medicare Other | Attending: Cardiovascular Disease

## 2017-03-29 ENCOUNTER — Other Ambulatory Visit: Payer: Self-pay

## 2017-03-29 VITALS — BP 131/68 | HR 88 | Ht 63.0 in | Wt 150.2 lb

## 2017-03-29 DIAGNOSIS — J90 Pleural effusion, not elsewhere classified: Secondary | ICD-10-CM | POA: Diagnosis not present

## 2017-03-29 DIAGNOSIS — I5041 Acute combined systolic (congestive) and diastolic (congestive) heart failure: Secondary | ICD-10-CM | POA: Diagnosis not present

## 2017-03-29 DIAGNOSIS — I5021 Acute systolic (congestive) heart failure: Secondary | ICD-10-CM | POA: Diagnosis present

## 2017-03-29 DIAGNOSIS — I110 Hypertensive heart disease with heart failure: Secondary | ICD-10-CM | POA: Insufficient documentation

## 2017-03-29 DIAGNOSIS — I2510 Atherosclerotic heart disease of native coronary artery without angina pectoris: Secondary | ICD-10-CM | POA: Diagnosis not present

## 2017-03-29 DIAGNOSIS — I340 Nonrheumatic mitral (valve) insufficiency: Secondary | ICD-10-CM | POA: Insufficient documentation

## 2017-03-29 DIAGNOSIS — R0602 Shortness of breath: Secondary | ICD-10-CM

## 2017-03-29 NOTE — Progress Notes (Signed)
Cardiology Office Note   Date:  03/29/2017   ID:  Christine [name], DOB Dec 12, 1921, MRN [id number]  PCP:  Lajean Manes, MD  Cardiologist:   Dorris Carnes, MD   Pt presents for eval of SOB and abnormal echo     History of Present Illness: Christine [name] is a 83 y.o. female with a history of SOB   Seen by Dr Felipa Eth  Referred for echo  Seen by Dr Kathryne Eriksson in past    Pt had good health for years  Says she eats well   Breathing got short one night about 2 wks ago  Felt like she wasy dying    Breathing prior to that was OK  That night she denies CP  Just got weaker  Smothering sensation  Fixed toddie and stopped but did not get better   Hearing and vision down from 2 wks ago   No presynciope    Meds:  None   Allergies:   Patient has no known allergies.   Past Medical History:  Diagnosis Date  . Arthritis   NIDDM  Past Surgical History:  Procedure Laterality Date  . ANTERIOR APPROACH HEMI HIP ARTHROPLASTY Left 03/28/2016   Procedure: ANTERIOR APPROACH HEMI HIP ARTHROPLASTY;  Surgeon: Dorna Leitz, MD;  Location: [facility];  Service: Orthopedics;  Laterality: Left;     Social History:  The patient  No history of drinking  REmote tobacco   Family History:  The patient's  Dad had heart trouble  Parents died in 38s    ROS:  Please see the history of present illness. All other systems are reviewed and  Negative to the above problem except as noted.    PHYSICAL EXAM: VS:  BP 131/68   Pulse 88   Ht 5\' 3"  (1.6 m)   Wt 150 lb 3.2 oz (68.1 kg)   SpO2 95%   BMI 26.61 kg/m   GEN: Well nourished, well developed, in no acute distress  HEENT: normal  Neck: JVP prominent, carotid bruits, or masses Cardiac: RRR; no murmurs, rubs, or gallops,Tr edema  Respiratory: Rales at bases   GI: soft, nontender, nondistended, + BS  No hepatomegaly  MS: no deformity Moving all extremities   Skin: warm and dry, no rash Neuro:  Strength and sensation are intact Psych: euthymic mood,  full affect   EKG:  EKG is ordered today.SR 88 bpm    Lipid Panel No results found for: CHOL, TRIG, HDL, CHOLHDL, VLDL, LDLCALC, LDLDIRECT    Wt Readings from Last 3 Encounters:  03/29/17 150 lb 3.2 oz (68.1 kg)  03/28/16 133 lb 6.1 oz (60.5 kg)      ASSESSMENT AND PLAN:  1  Dyspnea.  Pt with episode about 2 wks ago that was concerning  ? If sufffered an MI  Echo done today is abnormal  LVEF is severely depressed, around 20 to 25% with akinesis of anteroseptum, anterior and apical walls.  Increased filling pressures  L pleural effusion noted ON exam she has evid of some volume overload  Discussed possible etiologies for CHF   ? CAD  She is not having CP but is SOB  I would recomm getting labs   Pt will need lasix   She is reluctant about taking meds  Does not take glucophage that is prescribed (brings in flyeron dangers of opioids)  In regards to ischemic eval, given age and risk for complications would be reluctant to pursure any eval.    Will  forward to [employer] who sees pt at [facility]  F/U based on lab results/plan   Current medicines are reviewed at length with the patient today.  The patient does not have concerns regarding medicines.  Signed, Dorris Carnes, MD  03/29/2017 11:16 AM    [address], [place], [place]  [address] Phone: [phone]; Fax: [phone]

## 2017-03-29 NOTE — Patient Instructions (Signed)
Your physician recommends that you return for lab work today (bmet, bnp, tsh)  We will contact you after Dr. Harrington Challenger reviews your blood work results.

## 2017-03-30 ENCOUNTER — Telehealth: Payer: Self-pay | Admitting: *Deleted

## 2017-03-30 LAB — BASIC METABOLIC PANEL
BUN/Creatinine Ratio: 15 (ref 12–28)
BUN: 16 mg/dL (ref 10–36)
CALCIUM: 9.8 mg/dL (ref 8.7–10.3)
CO2: 22 mmol/L (ref 20–29)
Chloride: 103 mmol/L (ref 96–106)
Creatinine, Ser: 1.1 mg/dL — ABNORMAL HIGH (ref 0.57–1.00)
GFR calc Af Amer: 50 mL/min/{1.73_m2} — ABNORMAL LOW (ref >59)
GFR calc non Af Amer: 44 mL/min/{1.73_m2} — ABNORMAL LOW (ref >59)
Glucose: 254 mg/dL — ABNORMAL HIGH (ref 65–99)
POTASSIUM: 5.3 mmol/L — AB (ref 3.5–5.2)
Sodium: 141 mmol/L (ref 134–144)

## 2017-03-30 LAB — PRO B NATRIURETIC PEPTIDE: NT-Pro BNP: 5563 pg/mL — ABNORMAL HIGH (ref 0–738)

## 2017-03-30 LAB — TSH: TSH: 4.26 u[IU]/mL (ref 0.450–4.500)

## 2017-03-30 MED ORDER — FUROSEMIDE 40 MG PO TABS: 40.0000 mg | ORAL_TABLET | Freq: Every day | ORAL | 3 refills | Status: DC

## 2017-03-30 NOTE — Telephone Encounter (Signed)
Granddaughter informed and verbalized understanding. Per granddaughter, Quincy Carnes needed to be contacted also for appt and verification of dose of aspirin.   Currently staying at [facility] [address] [phone]. Contacted nurse with Quincy Carnes to give new orders and to verify aspirin dose.     Diane Pension scheme manager at [employer] informed. Per Diane, patient is currently on furosemide 20 mg & aspirin 81 mg daily. Per RN, patient is not compliant with medications. Diane request lab and echo report be faxed. After clarification on medication changes from Dr. Harrington Challenger, results will be faxed.   fax# [phone] Phone# [phone]

## 2017-03-30 NOTE — Telephone Encounter (Signed)
She should be on ecASA 81 mg  Increase lasix to 40 mg daily I am not convinced pt is taking meds   Informed pt may not be through [facility] yesterday

## 2017-03-30 NOTE — Telephone Encounter (Signed)
-----   Message from Fay Records, MD sent at 03/30/2017  2:14 PM EDT ----- Pt seen in clinic yesterday Echo with severeLV dysfunction  She was fluid overloaded I would recomm adding lasix 40 mg to regimen daily I would like to see pt back in clinic in 1 week for labs and t osee how she is doing  I am in clinic next Monday Further adjustments based on response She should be on  81 mg ecASA  Thyroid function normal

## 2017-03-30 NOTE — Telephone Encounter (Signed)
Diane RN informed and verbalized understanding. Medication adjustment has been faxed to the pharmacy per Diane RN request.  Lab work will be determined at office visit on Monday.  Copy of echo and lab work faxed to [facility] per her request.

## 2017-04-02 NOTE — Addendum Note (Signed)
Addended by: Rodman Key on: 04/02/2017 02:16 PM   Modules accepted: Orders

## 2017-04-05 ENCOUNTER — Encounter (INDEPENDENT_AMBULATORY_CARE_PROVIDER_SITE_OTHER): Payer: Self-pay

## 2017-04-05 ENCOUNTER — Encounter: Payer: Self-pay | Admitting: Internal Medicine

## 2017-04-05 ENCOUNTER — Ambulatory Visit (INDEPENDENT_AMBULATORY_CARE_PROVIDER_SITE_OTHER): Payer: Medicare Other | Admitting: Internal Medicine

## 2017-04-05 VITALS — BP 122/74 | HR 69 | Ht 63.0 in | Wt 145.0 lb

## 2017-04-05 DIAGNOSIS — I5023 Acute on chronic systolic (congestive) heart failure: Secondary | ICD-10-CM

## 2017-04-05 DIAGNOSIS — R0602 Shortness of breath: Secondary | ICD-10-CM

## 2017-04-05 MED ORDER — METOPROLOL SUCCINATE ER 25 MG PO TB24
25.0000 mg | ORAL_TABLET | Freq: Every day | ORAL | 6 refills | Status: DC
Start: 2017-04-05 — End: 2017-10-19

## 2017-04-05 NOTE — Progress Notes (Signed)
Cardiology Office Note   Date:  04/05/2017   ID:  Christine [name], DOB [birth year], MRN [id number]  PCP:  Lajean Manes, MD  Cardiologist:   Dorris Carnes, MD   Pt presents for eval of SOB and systolic CHF    History of Present Illness: Christine [name] is a 83 y.o. female with a history of Systolic CHF and probable CAD   Had episode concerning for MI several weeks ago.  Echo done on 8/20 with severe LV dysfunction with regional wall motion abnormaliites I saw her for the first time one week ago  She was SOB and with evid of volume overload on exam  I recomm Lasix 40 Since seen tired  Breathing is short at times  She says no real change from before Lasix ONe episode of CP this AM  Lasted about 1 min     Allergies:   Patient has no known allergies.   Past Medical History:  Diagnosis Date  . Arthritis   NIDDM  Past Surgical History:  Procedure Laterality Date  . ANTERIOR APPROACH HEMI HIP ARTHROPLASTY Left 03/28/2016   Procedure: ANTERIOR APPROACH HEMI HIP ARTHROPLASTY;  Surgeon: Dorna Leitz, MD;  Location: [facility];  Service: Orthopedics;  Laterality: Left;     Social History:  The patient  No history of drinking  REmote tobacco   Family History:  The patient's  Dad had heart trouble  Parents died in 30s    ROS:  Please see the history of present illness. All other systems are reviewed and  Negative to the above problem except as noted.    PHYSICAL EXAM: VS:  BP 122/74   Pulse 69   Ht 5\' 3"  (1.6 m)   Wt 145 lb (65.8 kg)   SpO2 98%   BMI 25.69 kg/m   GEN: Well nourished, well developed, in no acute distress  HEENT: normal  Neck: JVP prominent, carotid bruits, or masses Cardiac: RRR; no murmurs, rubs, or gallops,NO edema  Respiratory: Clear   GI: soft, nontender, nondistended, + BS  No hepatomegaly  MS: no deformity Moving all extremities   Skin: warm and dry, no rash Neuro:  Strength and sensation are intact Psych: euthymic mood, full affect   EKG:  Not done      Lipid Panel No results found for: CHOL, TRIG, HDL, CHOLHDL, VLDL, LDLCALC, LDLDIRECT    Wt Readings from Last 3 Encounters:  04/05/17 145 lb (65.8 kg)  03/29/17 150 lb 3.2 oz (68.1 kg)  03/28/16 133 lb 6.1 oz (60.5 kg)      ASSESSMENT AND PLAN:  1  Acute on chronic systolic CHF  LVEF 20 WH67% with regional wall motion abnormalities Most likely pt had event several weeks ago   SInce last week when lasix added pt says her symtpoms of fatigue and SOB are about the same  BUt on exam, volume appears better  I would continue current meds  Check BMET nad BNP today Start Toprol XL 25 daily F/U in clinic in 1 to 2 wks    Will check lipids today  Should be on a statin    Plan for conservative Rx with medical Rx    Current medicines are reviewed at length with the patient today.  The patient does not have concerns regarding medicines.  Signed, Dorris Carnes, MD  04/05/2017 1:57 PM    [facility] [place], [place], [place]  [address] Phone: [phone]; Fax: [phone]

## 2017-04-05 NOTE — Patient Instructions (Signed)
Your physician has recommended you make the following change in your medication:  1.) start Toprol XL (metoprolol succinate) 25 mg ONCE A DAY  Your physician recommends that you return for lab work in: BMET, BNP, [place] physician recommends that you schedule a follow-up appointment in:  2 weeks with Dr. Harrington Challenger.

## 2017-04-06 LAB — LIPID PANEL
CHOLESTEROL TOTAL: 209 mg/dL — AB (ref 100–199)
Chol/HDL Ratio: 4.6 ratio — ABNORMAL HIGH (ref 0.0–4.4)
HDL: 45 mg/dL (ref >39)
LDL CALC: 125 mg/dL — AB (ref 0–99)
TRIGLYCERIDES: 196 mg/dL — AB (ref 0–149)
VLDL Cholesterol Cal: 39 mg/dL (ref 5–40)

## 2017-04-06 LAB — BASIC METABOLIC PANEL
BUN / CREAT RATIO: 27 (ref 12–28)
BUN: 26 mg/dL (ref 10–36)
CO2: 23 mmol/L (ref 20–29)
CREATININE: 0.98 mg/dL (ref 0.57–1.00)
Calcium: 9.9 mg/dL (ref 8.7–10.3)
Chloride: 94 mmol/L — ABNORMAL LOW (ref 96–106)
GFR calc Af Amer: 58 mL/min/{1.73_m2} — ABNORMAL LOW (ref >59)
GFR calc non Af Amer: 50 mL/min/{1.73_m2} — ABNORMAL LOW (ref >59)
GLUCOSE: 130 mg/dL — AB (ref 65–99)
Potassium: 4.9 mmol/L (ref 3.5–5.2)
Sodium: 140 mmol/L (ref 134–144)

## 2017-04-06 LAB — PRO B NATRIURETIC PEPTIDE: NT-PRO BNP: 3146 pg/mL — AB (ref 0–738)

## 2017-04-16 ENCOUNTER — Ambulatory Visit: Payer: Medicare Other | Admitting: Internal Medicine

## 2017-04-26 ENCOUNTER — Encounter: Payer: Self-pay | Admitting: Internal Medicine

## 2017-04-26 ENCOUNTER — Ambulatory Visit: Payer: Medicare Other | Admitting: Internal Medicine

## 2017-04-27 ENCOUNTER — Encounter: Payer: Self-pay | Admitting: Internal Medicine

## 2017-05-05 ENCOUNTER — Telehealth: Payer: Self-pay | Admitting: *Deleted

## 2017-05-05 NOTE — Telephone Encounter (Signed)
-----   Message from Dorris Carnes V, MD sent at 04/07/2017  6:59 AM EDT ----- Fluid is improving  Kidney function normal LDL is elevated 125  Pt needs to be on statin   Will review when in clinic next

## 2017-05-05 NOTE — Telephone Encounter (Signed)
Pt was no show for appointment on 04/16/17. I called granddaughter (mobile, preferred #) who recommends I contact [facility] [phone] to arrange follow up.  Left message for Mallory Shirk (director of home health), advising that she was scheduled and did not come in on 9/7 and asked her to call back to schedule follow up.

## 2017-05-17 ENCOUNTER — Ambulatory Visit (INDEPENDENT_AMBULATORY_CARE_PROVIDER_SITE_OTHER): Payer: Medicare Other | Admitting: Physician Assistant

## 2017-05-17 ENCOUNTER — Telehealth: Payer: Self-pay | Admitting: *Deleted

## 2017-05-17 ENCOUNTER — Encounter: Payer: Self-pay | Admitting: Physician Assistant

## 2017-05-17 VITALS — BP 122/50 | HR 69 | Ht 63.0 in | Wt 150.4 lb

## 2017-05-17 DIAGNOSIS — I420 Dilated cardiomyopathy: Secondary | ICD-10-CM

## 2017-05-17 DIAGNOSIS — I5022 Chronic systolic (congestive) heart failure: Secondary | ICD-10-CM | POA: Diagnosis not present

## 2017-05-17 DIAGNOSIS — E785 Hyperlipidemia, unspecified: Secondary | ICD-10-CM

## 2017-05-17 HISTORY — DX: Dilated cardiomyopathy: I42.0

## 2017-05-17 HISTORY — DX: Chronic systolic (congestive) heart failure: I50.22

## 2017-05-17 LAB — BASIC METABOLIC PANEL
BUN/Creatinine Ratio: 23 (ref 12–28)
BUN: 26 mg/dL (ref 10–36)
CALCIUM: 9.7 mg/dL (ref 8.7–10.3)
CHLORIDE: 100 mmol/L (ref 96–106)
CO2: 25 mmol/L (ref 20–29)
Creatinine, Ser: 1.12 mg/dL — ABNORMAL HIGH (ref 0.57–1.00)
GFR calc Af Amer: 49 mL/min/{1.73_m2} — ABNORMAL LOW (ref >59)
GFR calc non Af Amer: 43 mL/min/{1.73_m2} — ABNORMAL LOW (ref >59)
GLUCOSE: 174 mg/dL — AB (ref 65–99)
POTASSIUM: 4.9 mmol/L (ref 3.5–5.2)
SODIUM: 141 mmol/L (ref 134–144)

## 2017-05-17 MED ORDER — PRAVASTATIN SODIUM 20 MG PO TABS: 20.0000 mg | ORAL_TABLET | Freq: Every evening | ORAL | 3 refills | Status: DC

## 2017-05-17 NOTE — Telephone Encounter (Signed)
DPR for granddaughter Christine [name] asked we please call  pt's son or Christine [name]. Said her name was put on at one time though she travels a lot for work so it is better to call pt's son or SNF.    Pt resides at [facility]. I tried to reach nursing facility though the phone just rang and rang with no pick up. I will ask one of the Triage nurses to try and reach SNF nurse so that we may go over results and recommendations. Phone # for [employer] is [phone].

## 2017-05-17 NOTE — Telephone Encounter (Signed)
-----   Message from Liliane Shi, [place] sent at 05/17/2017  5:33 PM EDT ----- Please call the patient Creatinine increased some. Decrease Lasix to 20 mg Once daily  Call if weight increases 3 lbs or more in 1 day. BMET 1 week. Richardson Dopp, PA-C    05/17/2017 5:33 PM

## 2017-05-17 NOTE — Progress Notes (Signed)
Cardiology Office Note:    Date:  05/17/2017   ID:  Christine [name], DOB [birth year], MRN [id number]  PCP:  Lajean Manes, MD  Cardiologist:  Dr. Dorris Carnes    Referring MD: Lajean Manes, MD   Chief Complaint  Patient presents with  . Congestive Heart Failure    follow up    History of Present Illness:    Christine [name] is a 83 y.o. female with a hx of systolic HF, probable CAD, diabetes.  She was initially seen by Dr. Dorris Carnes in 03/2017.  She had what sounded like an myocardial infarction several weeks prior.  EF is 20-25 on echocardiogram.  It was felt that she should be managed conservatively given advanced age and risk for complications.  Last seen 04/05/17.    Christine [name] returns for follow up.  She is here with her caregiver from [facility].  She is doing well.  She feels like she can breathe better.  She denies chest pain, paroxysmal nocturnal dyspnea, edema, syncope. She denies bleeding issues.  Her caregiver notes she is more active.    Prior CV studies:   The following studies were reviewed today:  Echocardiogram 03/29/17 EF 20-25, severe diffuse HK with anteroseptal, anterior, apical akinesis, Doppler parameters consistent with restrictive physiology, no evidence of thrombus, mild to moderate MR, mild LAE, PASP 33, trivial effusion, left pleural effusion  Past Medical History:  Diagnosis Date  . Arthritis   . Chronic systolic CHF (congestive heart failure) ([place]) 05/17/2017   Echo 8/18 - EF 20-25, severe diffuse HK with anteroseptal, anterior, apical AK, Doppler parameters c/w restrictive physio, no evidence of thrombus, mild to mod MR, mild LAE, PASP 33, trivial effusion, L pleural effusion  . Dilated cardiomyopathy ([place]) 05/17/2017   probably ischemic; presumed CAD given wall motion abnl on Echo 8/18    Past Surgical History:  Procedure Laterality Date  . ANTERIOR APPROACH HEMI HIP ARTHROPLASTY Left 03/28/2016   Procedure: ANTERIOR APPROACH HEMI HIP  ARTHROPLASTY;  Surgeon: Dorna Leitz, MD;  Location: [facility];  Service: Orthopedics;  Laterality: Left;    Current Medications: Current Meds  Medication Sig  . acetaminophen (TYLENOL) 500 MG tablet Take 500 mg by mouth every 6 (six) hours as needed for mild pain or headache.  Marland Kitchen aspirin EC 81 MG tablet Take 81 mg by mouth daily.  . furosemide (LASIX) 40 MG tablet Take 1 tablet (40 mg total) by mouth daily.  . metoprolol succinate (TOPROL XL) 25 MG 24 hr tablet Take 1 tablet (25 mg total) by mouth daily.  Marland Kitchen PRESCRIPTION MEDICATION Take 1 tablet by mouth 2 (two) times daily. Per patients son she is on a medication for diabetes but he doesn't know what it is,neither of them know her pharmacy and the nursing home will not answer the phone.     Allergies:   Patient has no known allergies.   Social History   Social History  . Marital status: Widowed    Spouse name: N/A  . Number of children: N/A  . Years of education: N/A   Social History Main Topics  . Smoking status: Never Smoker  . Smokeless tobacco: Never Used  . Alcohol use No  . Drug use: No  . Sexual activity: No   Other Topics Concern  . None   Social History Narrative  . None     Family Hx: The patient's Family history is unknown by patient.  ROS:   Please see the history of  present illness.    Review of Systems  HENT: Positive for hearing loss.   Musculoskeletal: Positive for myalgias.   All other systems reviewed and are negative.   EKGs/Labs/Other Test Reviewed:    EKG:  EKG is   ordered today.  The ekg ordered today demonstrates Normal sinus rhythm, HR 69, normal axis, T-wave inversions 1, aVL, QTc 490 ms  Recent Labs: 03/29/2017: TSH 4.260 04/05/2017: NT-Pro BNP 3,146 05/17/2017: BUN 26; Creatinine, Ser 1.12; Potassium 4.9; Sodium 141   Recent Lipid Panel Lab Results  Component Value Date/Time   CHOL 209 (H) 04/05/2017 02:33 PM   TRIG 196 (H) 04/05/2017 02:33 PM   HDL 45 04/05/2017 02:33 PM   CHOLHDL  4.6 (H) 04/05/2017 02:33 PM   LDLCALC 125 (H) 04/05/2017 02:33 PM    Physical Exam:    VS:  BP (!) 122/50   Pulse 69   Ht 5\' 3"  (1.6 m)   Wt 150 lb 6.4 oz (68.2 kg)   BMI 26.64 kg/m     Wt Readings from Last 3 Encounters:  05/17/17 150 lb 6.4 oz (68.2 kg)  04/05/17 145 lb (65.8 kg)  03/29/17 150 lb 3.2 oz (68.1 kg)     Physical Exam  Constitutional: She is oriented to person, place, and time. She appears well-developed and well-nourished. No distress.  HENT:  Head: Normocephalic and atraumatic.  Eyes: No scleral icterus.  Neck: No JVD present.  Cardiovascular: Normal rate and regular rhythm.   No murmur heard. Pulmonary/Chest: Effort normal. She has no rales.  Abdominal: Soft.  Musculoskeletal: She exhibits no edema.  Neurological: She is alert and oriented to person, place, and time.  Skin: Skin is warm and dry.  Psychiatric: She has a normal mood and affect.    ASSESSMENT:    1. Chronic systolic CHF (congestive heart failure) ([place])   2. Dilated cardiomyopathy ([place])   3. Hyperlipidemia, unspecified hyperlipidemia type    PLAN:    In order of problems listed above:  1. Chronic systolic CHF (congestive heart failure) ([place]) She is New York Heart Association class II. Her volume is currently stable. We will obtain a follow-up basic metabolic panel today to recheck her renal function and potassium. She brings in a list of her blood pressures from the nursing home. They are somewhat tenuous. I would like to hold off on starting an ACE inhibitor at this point. She will continue current dose of Lasix and beta blocker.  Consider Lisinopril 2.5 mg QD at follow up.    2. Dilated cardiomyopathy ([place]) Presumes to be ischemic.  Dr. Harrington Challenger has opted for medical management.  She is not having angina.   3. Hyperlipidemia, unspecified hyperlipidemia type  We discussed the benefits for statin Rx.  Will start Pravastatin 20 mg QHS.  Check Lipids and LFTs in 3 mos.    Dispo:  Return  in about 4 weeks (around 06/14/2017) for Close Follow Up, w/ Dr. Harrington Challenger, or Richardson Dopp, PA-C.   Medication Adjustments/Labs and Tests Ordered: Current medicines are reviewed at length with the patient today.  Concerns regarding medicines are outlined above.  Tests Ordered: Orders Placed This Encounter  Procedures  . Basic Metabolic Panel (BMET)  . Lipid Profile  . Hepatic function panel  . EKG 12-Lead   Medication Changes: Meds ordered this encounter  Medications  . pravastatin (PRAVACHOL) 20 MG tablet    Sig: Take 1 tablet (20 mg total) by mouth every evening.    Dispense:  90 tablet  Refill:  3    Signed, Richardson Dopp, PA-C  05/17/2017 5:06 PM    [facility] [place], [place],   [address] Phone: [phone]; Fax: [phone]

## 2017-05-17 NOTE — Patient Instructions (Addendum)
Medication Instructions:  Start Pravastatin 20 mg once daily at bedtime.   Labwork: Today - BMET  In 3 months - Fasting Lipids and LFTs  Testing/Procedures: None   Follow-Up: Dr. Dorris Carnes or Richardson Dopp, PA-C in 3-4 weeks   Any Other Special Instructions Will Be Listed Below (If Applicable).  If you need a refill on your cardiac medications before your next appointment, please call your pharmacy.

## 2017-05-26 ENCOUNTER — Encounter: Payer: Self-pay | Admitting: Physician Assistant

## 2017-06-08 ENCOUNTER — Encounter: Payer: Self-pay | Admitting: Physician Assistant

## 2017-06-08 ENCOUNTER — Ambulatory Visit (INDEPENDENT_AMBULATORY_CARE_PROVIDER_SITE_OTHER): Payer: Medicare Other | Admitting: Physician Assistant

## 2017-06-08 VITALS — BP 106/46 | HR 64 | Ht 63.0 in | Wt 150.0 lb

## 2017-06-08 DIAGNOSIS — E785 Hyperlipidemia, unspecified: Secondary | ICD-10-CM | POA: Diagnosis not present

## 2017-06-08 DIAGNOSIS — I5022 Chronic systolic (congestive) heart failure: Secondary | ICD-10-CM

## 2017-06-08 LAB — BASIC METABOLIC PANEL
BUN/Creatinine Ratio: 25 (ref 12–28)
BUN: 31 mg/dL (ref 10–36)
CALCIUM: 9.7 mg/dL (ref 8.7–10.3)
CO2: 25 mmol/L (ref 20–29)
CREATININE: 1.24 mg/dL — AB (ref 0.57–1.00)
Chloride: 100 mmol/L (ref 96–106)
GFR calc Af Amer: 44 mL/min/{1.73_m2} — ABNORMAL LOW (ref >59)
GFR, EST NON AFRICAN AMERICAN: 38 mL/min/{1.73_m2} — AB (ref >59)
Glucose: 135 mg/dL — ABNORMAL HIGH (ref 65–99)
Potassium: 5.2 mmol/L (ref 3.5–5.2)
SODIUM: 139 mmol/L (ref 134–144)

## 2017-06-08 NOTE — Patient Instructions (Signed)
Medication Instructions:  Your physician recommends that you continue on your current medications as directed. Please refer to the Current Medication list given to you today.   Labwork: 1. TODAY BMET  2. LAB WORK ON 08/16/17 IS FASTING LAB WORK; NOTHING TO EAT OR DRINK AFTER MIDNIGHT THE  NIGHT BEFORE LAB WORK   Testing/Procedures: NONE ORDERED TODAY  Follow-Up: DR. Harrington Challenger 09/02/17 @ 9:20   Any Other Special Instructions Will Be Listed Below (If Applicable).     If you need a refill on your cardiac medications before your next appointment, please call your pharmacy.

## 2017-06-08 NOTE — Progress Notes (Signed)
Cardiology Office Note:    Date:  06/08/2017   ID:  Christine [name], DOB 12-22-1921, MRN [id number]  PCP:  Christine Manes, MD  Cardiologist:  Dr. Dorris [name]    Referring MD: Christine Manes, MD   Chief Complaint  Patient presents with  . Congestive Heart Failure    Follow-up    History of Present Illness:    Christine [name] is a 83 y.o. female with a hx of systolic HF, probable CAD, diabetes.  She was initially seen by Dr. Dorris [name] in 03/2017.  She had what sounded like an myocardial infarction several weeks prior.  EF is 20-25 on echocardiogram.  It was felt that she should be managed conservatively given 83 years old and risk for complications. and risk for complications.  She was last seen in follow-up 05/17/17.  She returns for follow-up and further titration of heart failure medications.     Christine [name] returns for follow-up.  She is here with her caretaker from [place].  She denies chest discomfort, shortness of breath, syncope, orthopnea, PND or edema.  She thinks that some of her medications are causing itchiness of her nose.  She shared with me that she heard news reports recently that former President Obama had created a program where patients younger than 81 years old would get medications that are beneficial.  However, if patients were over 50, they would get medications that would hurt them.  I tried to dispel this myth today.  She also showed me a flyer from [place] that outlines the dangers of opioids.  She has some pills attached to the flyer.  She is not sure what these are.  I explained to her that she is not taking opioids and she should throw away this unknown medication.  Prior CV studies:   The following studies were reviewed today:  Echocardiogram 03/29/17 EF 20-25, severe diffuse HK with anteroseptal, anterior, apical akinesis, Doppler parameters consistent with restrictive physiology, no evidence of thrombus, mild to moderate MR, mild LAE, PASP 33, trivial effusion, left  pleural effusion  Past Medical History:  Diagnosis Date  . Arthritis   . Chronic systolic CHF (congestive heart failure) ([place]) 05/17/2017   Echo 8/18 - EF 20-25, severe diffuse HK with anteroseptal, anterior, apical AK, Doppler parameters c/w restrictive physio, no evidence of thrombus, mild to mod MR, mild LAE, PASP 33, trivial effusion, L pleural effusion  . Dilated cardiomyopathy ([place]) 05/17/2017   probably ischemic; presumed CAD given wall motion abnl on Echo 8/18    Past Surgical History:  Procedure Laterality Date  . ANTERIOR APPROACH HEMI HIP ARTHROPLASTY Left 03/28/2016   Procedure: ANTERIOR APPROACH HEMI HIP ARTHROPLASTY;  Surgeon: Dorna Leitz, MD;  Location: [facility];  Service: Orthopedics;  Laterality: Left;    Current Medications: Current Meds  Medication Sig  . acetaminophen (TYLENOL) 500 MG tablet Take 500 mg by mouth every 6 (six) hours as needed for mild pain or headache.  Marland Kitchen aspirin EC 81 MG tablet Take 81 mg by mouth daily.  . furosemide (LASIX) 20 MG tablet Take 20 mg by mouth daily.  . metoprolol succinate (TOPROL XL) 25 MG 24 hr tablet Take 1 tablet (25 mg total) by mouth daily.  . pravastatin (PRAVACHOL) 20 MG tablet Take 1 tablet (20 mg total) by mouth every evening.  Marland Kitchen PRESCRIPTION MEDICATION Take 1 tablet by mouth 2 (two) times daily. Per patients son she is on a medication for diabetes but he doesn't know what it is,neither of them know  her pharmacy and the nursing home will not answer the phone.     Allergies:   Patient has no known allergies.   Social History  Substance Use Topics  . Smoking status: Never Smoker  . Smokeless tobacco: Never Used  . Alcohol use No     Family Hx: The patient's Family history is unknown by patient.  ROS:   Please see the history of present illness.    ROS All other systems reviewed and are negative.   EKGs/Labs/Other Test Reviewed:    EKG:  EKG is  ordered today.  The ekg ordered today demonstrates normal sinus  rhythm, heart rate 64, T wave inversion 1, aVL, QTC 493 ms, no significant change when compared to prior tracings  Recent Labs: 03/29/2017: TSH 4.260 04/05/2017: NT-Pro BNP 3,146 05/17/2017: BUN 26; Creatinine, Ser 1.12; Potassium 4.9; Sodium 141   Recent Lipid Panel Lab Results  Component Value Date/Time   CHOL 209 (H) 04/05/2017 02:33 PM   TRIG 196 (H) 04/05/2017 02:33 PM   HDL 45 04/05/2017 02:33 PM   CHOLHDL 4.6 (H) 04/05/2017 02:33 PM   LDLCALC 125 (H) 04/05/2017 02:33 PM    Physical Exam:    VS:  BP (!) 106/46   Pulse 64   Ht 5\' 3"  (1.6 m)   Wt 150 lb (68 kg)   BMI 26.57 kg/m     Wt Readings from Last 3 Encounters:  06/08/17 150 lb (68 kg)  05/17/17 150 lb 6.4 oz (68.2 kg)  04/05/17 145 lb (65.8 kg)     Physical Exam  Constitutional: She is oriented to person, place, and time. She appears well-developed and well-nourished. No distress.  HENT:  Head: Normocephalic and atraumatic.  Neck: No JVD present.  Cardiovascular: Normal rate and regular rhythm.   No murmur heard. Pulmonary/Chest: Effort normal. She has no rales.  Abdominal: Soft.  Musculoskeletal: She exhibits no edema.  Neurological: She is alert and oriented to person, place, and time.  Skin: Skin is warm and dry.    ASSESSMENT:    1. Chronic systolic CHF (congestive heart failure) ([place])   2. Hyperlipidemia, unspecified hyperlipidemia type    PLAN:    In order of problems listed above:  1.  Chronic systolic CHF (congestive heart failure) ([facility]) Presumed ischemic cardiomyopathy.  She is New York Heart Association class II.  Volume appears to be stable.  She should currently be on Lasix 20 mg daily.  We will make sure that her medication list is correct.  She needs a follow-up basic metabolic panel today.  As noted above in the history of present illness, she is concerned about medications and is not really interested in adding ACE inhibitor therapy at this time.  Therefore, continue current medications.   Follow-up with Dr. Harrington Challenger in 3 months.  2.  Hyperlipidemia, unspecified hyperlipidemia type Follow-up lipids and LFTs are pending in January.  Dispo:  Return in about 3 months (around 09/08/2017) for Routine Follow Up, w/ Dr. Harrington Challenger.   Medication Adjustments/Labs and Tests Ordered: Current medicines are reviewed at length with the patient today.  Concerns regarding medicines are outlined above.  Tests Ordered: Orders Placed This Encounter  Procedures  . Basic Metabolic Panel (BMET)  . EKG 12-Lead   Medication Changes: No orders of the defined types were placed in this encounter.   Signed, Richardson Dopp, PA-C  06/08/2017 1:01 PM    [facility] [place], [place], [place]  [address] Phone: [phone];  Fax: [phone]

## 2017-06-09 ENCOUNTER — Telehealth: Payer: Self-pay | Admitting: *Deleted

## 2017-06-09 MED ORDER — FUROSEMIDE 20 MG PO TABS: 20.0000 mg | ORAL_TABLET | ORAL | Status: DC

## 2017-06-09 NOTE — Telephone Encounter (Signed)
-----   Message from Liliane Shi, [place] sent at 06/08/2017  9:57 PM EDT ----- Please call the patient Creatinine elevated.  Change Lasix to 20 mg every Monday, Wednesday, Friday.  Repeat BMET 2 weeks. Weigh daily.  Take Lasix 20 mg if weight increases by 3 or more lbs in 1 day. Richardson Dopp, PA-C 06/08/2017 9:57 PM

## 2017-06-09 NOTE — Telephone Encounter (Signed)
I s/w Diane, RN for pt. Advised of lab results and dose change with pt's Lasix. Advised pt needs to try and weigh daily as due to change in Lasix dose. Bmet to be done in 2 weeks with the results to please be faxed to [place], [place]. Diane was also going to fax over [facility] since this was not sent with the pt for her appt 06/08/17. Diane verbalized understanding to plan of care for pt. I will fax results with recommendations over to [facility] fax # [phone].

## 2017-06-24 ENCOUNTER — Telehealth: Payer: Self-pay | Admitting: Internal Medicine

## 2017-06-24 MED ORDER — FUROSEMIDE 20 MG PO TABS: 20.0000 mg | ORAL_TABLET | ORAL | 11 refills | Status: DC

## 2017-06-24 NOTE — Telephone Encounter (Signed)
New Message    *STAT* If patient is at the pharmacy, call can be transferred to refill team.   1. Which medications need to be refilled? (please list name of each medication and dose if known) furosemide (LASIX) 20 MG tablet  2. Which pharmacy/location (including street and city if local pharmacy) is medication to be sent to? [place], [place]  3. Do they need a 30 day or 90 day supply? 30 day  Per Pharmacist [place] Dr. Harrington Challenger changed the prescription and they never received prescription.

## 2017-06-24 NOTE — Telephone Encounter (Signed)
Pt's medication was sent to pt's pharmacy as requested. Confirmation received.  °

## 2017-07-05 ENCOUNTER — Telehealth: Payer: Self-pay | Admitting: Internal Medicine

## 2017-07-05 NOTE — Telephone Encounter (Signed)
Per Quincy Carnes pt gained 7# since 06/30/17.  No SOB.  LE edema.  Taking lasix 20 mg every M-W-F.   Will route to Dr. Harrington Challenger for recommendations.

## 2017-07-05 NOTE — Telephone Encounter (Signed)
New message   Pt c/o swelling: STAT is pt has developed SOB within 24 hours  1) How much weight have you gained and in what time span? 7lbs since the 11/21  2) If swelling, where is the swelling located? Lower extermity    3) Are you currently taking a fluid pill?  furosemide (LASIX) 20 MG tablet  Take 1 tablet (20 mg total) every Monday, Wednesday, and Friday by mouth.  4) Are you currently SOB?  No,  just tired.   5) Do you have a log of your daily weights (if so, list)?150 on 11/21  And 157 today  After breakfast   6) Have you gained 3 pounds in a day or 5 pounds in a week? Yes   7) Have you traveled recently? No  /

## 2017-07-06 NOTE — Telephone Encounter (Signed)
Left message to call back  

## 2017-07-06 NOTE — Telephone Encounter (Signed)
Recommend 40 mg lasix daily with 10 KCL  Check BMET in 1 week Should respond  If weight continues to go up call

## 2017-07-08 MED ORDER — FUROSEMIDE 40 MG PO TABS: 40.0000 mg | ORAL_TABLET | Freq: Every day | ORAL | 3 refills | Status: DC

## 2017-07-08 MED ORDER — POTASSIUM CHLORIDE ER 10 MEQ PO TBCR: 10.0000 meq | EXTENDED_RELEASE_TABLET | Freq: Every day | ORAL | 3 refills | Status: DC

## 2017-07-08 NOTE — Telephone Encounter (Signed)
Called back to the number listed again to ask for Carolin Sicks but it was still [employer]	[place], who is out of town but works at [employer].  She confirmed that they have orders for patient to have lasix 40 mg daily and potassium chloride 10 meq daily and BMET in 1 week.   Aware that patient should respond to this and to call if weight continues to increase.    She asked me to send med changes to [facility].

## 2017-07-08 NOTE — Telephone Encounter (Signed)
Follow up  1st call, overhead for rn no answer, call disconnected   2nd call back Latosha verbalized that she want to confirm that she has received the order at [facility]

## 2017-07-08 NOTE — Telephone Encounter (Signed)
Left message for nancy davis to call to verify if they've received the recommended medication adjustment for this patient.  Asked that she call back.

## 2017-07-09 ENCOUNTER — Telehealth: Payer: Self-pay | Admitting: *Deleted

## 2017-07-09 NOTE — Telephone Encounter (Signed)
Call from nancy, nurse at [facility]. Wanted to update Dr. Harrington Challenger. Pt was started on Entresto by PCP in September. She is also on Trajenta and glimepiride for diabetes.  No longer taking metformin.  Her weight has decreased from 157 to 155 and she had never started the lasix 40 mg daily.  They will make sure she starts today as well as potassium 10 meq.  Her baseline weight is 149-151 per [name].  They have her labs scheduled for next Thursday.  The pharmacist there told her pt should not take trajenta with heart failure.  I adv that should be discussed with the prescriber, her PCP.  She is aware I am forwarding to Dr. Harrington Challenger for review and will call her with any new recommendations.

## 2017-07-09 NOTE — Telephone Encounter (Signed)
Follow up   rn returning call for rn

## 2017-07-12 NOTE — Telephone Encounter (Signed)
Agree with plan 

## 2017-08-12 ENCOUNTER — Telehealth: Payer: Self-pay | Admitting: Internal Medicine

## 2017-08-12 MED ORDER — FUROSEMIDE 20 MG PO TABS: ORAL_TABLET | ORAL | Status: DC

## 2017-08-12 NOTE — Telephone Encounter (Signed)
I spoke with Maple Mirza. RN at [facility]. Prior to holidays, pt's weight was 150-154.  Monday and Tue this week 158 and today 157 pounds.   No SOB, no DOE, lungs clear, has trace ankle edema, abd is a little larger per patient.  She reports eating salty foods like ham, ribs, sausage gravy and biscuits.  Low salt diet was discussed with patient by RN at [facility]. RN reports pt agreeable to taking an extra lasix today. Her dose is 20 mg MWF.  Nurse will give her extra lasix 20 mg today. She is scheduled for labs Mon 08/16/17 then with Dr. Harrington Challenger on 09/02/17. Similar weight change after Thanksgiving.  Weight returned to her normal after several days, without medication changes.   Reviewed labs from 06/08/17, notes state patient may take additional 20 mg lasix if weight increases by 3 pounds in one day.  Faxed these results/notes to [facility]. Will route to Dr. Harrington Challenger for review.

## 2017-08-12 NOTE — Telephone Encounter (Signed)
New Message  [name] from [facility] call requesting to speak with RN with some questions about pt.

## 2017-08-13 ENCOUNTER — Telehealth: Payer: Self-pay | Admitting: Internal Medicine

## 2017-08-13 NOTE — Telephone Encounter (Signed)
Left message for [name], Therapist, sports at [place]. Advised I received her fax requesting prescription for lasix daily as needed for wt gain of 3 pounds or more in one day as per lab notes recorded 06/09/17 by Richardson Dopp, PA-C.  [name], RN is aware scheduled for lab work in our office on Mon 08/16/17.

## 2017-08-13 NOTE — Telephone Encounter (Signed)
[name] (RN at [facility]) calling, would like to discuss patient.

## 2017-08-13 NOTE — Telephone Encounter (Signed)
Received second fax from RN at [facility] stating pt's weight was 157 again today and she is not scheduled for any further lasix until Monday.  Faxed orders from Dr. Harrington Challenger to give lasix 40 mg on 08/14/17, lab work Monday to [place], [place]: [name].

## 2017-08-13 NOTE — Telephone Encounter (Signed)
Addressed  Will give additional lasix (40 mg on Saturday)  Due to get labs on Monday

## 2017-08-16 ENCOUNTER — Other Ambulatory Visit: Payer: Medicare Other | Admitting: *Deleted

## 2017-08-16 DIAGNOSIS — E785 Hyperlipidemia, unspecified: Secondary | ICD-10-CM

## 2017-08-16 LAB — LIPID PANEL
CHOLESTEROL TOTAL: 191 mg/dL (ref 100–199)
Chol/HDL Ratio: 3.4 ratio (ref 0.0–4.4)
HDL: 57 mg/dL (ref >39)
LDL Calculated: 121 mg/dL — ABNORMAL HIGH (ref 0–99)
Triglycerides: 66 mg/dL (ref 0–149)
VLDL Cholesterol Cal: 13 mg/dL (ref 5–40)

## 2017-08-16 LAB — HEPATIC FUNCTION PANEL
ALK PHOS: 51 IU/L (ref 39–117)
ALT: 14 IU/L (ref 0–32)
AST: 16 IU/L (ref 0–40)
Albumin: 4.2 g/dL (ref 3.2–4.6)
BILIRUBIN TOTAL: 0.3 mg/dL (ref 0.0–1.2)
BILIRUBIN, DIRECT: 0.1 mg/dL (ref 0.00–0.40)
Total Protein: 6.9 g/dL (ref 6.0–8.5)

## 2017-08-17 ENCOUNTER — Telehealth: Payer: Self-pay | Admitting: *Deleted

## 2017-08-17 DIAGNOSIS — E785 Hyperlipidemia, unspecified: Secondary | ICD-10-CM

## 2017-08-17 MED ORDER — ATORVASTATIN CALCIUM 20 MG PO TABS: 20.0000 mg | ORAL_TABLET | Freq: Every day | ORAL | 3 refills | Status: DC

## 2017-08-17 NOTE — Telephone Encounter (Signed)
I have d/w Richardson Dopp, PA in regards to pt's weight has not changed much recently, see previous note from earlier today. Per Richardson Dopp, PA have pt take 1 extra dose of lasix 20 mg today. I will fax this order over to Maple Mirza at [facility] to update as to our conversation earlier.

## 2017-08-17 NOTE — Telephone Encounter (Signed)
-----   Message from Liliane Shi, [place] sent at 08/16/2017  9:51 PM EST ----- Please call the patient LFTs ok.  LDL is too high. DC Pravastatin Start Atorvastatin 20 mg Once daily  Check Lipids and LFTs in 3 months.  Richardson Dopp, PA-C    08/16/2017 9:51 PM

## 2017-08-17 NOTE — Telephone Encounter (Signed)
Follow up     Christine [name] is returning carol call from [facility]

## 2017-08-17 NOTE — Telephone Encounter (Signed)
I returned call to Maple Mirza, RN for the pt. I d/w [name] lab results and medication changes due to LDL still too high. I will fax over results with recommendations to [phone] attn: Maple Mirza, RN. While on the phone [name] does state to me that the pt's weight has not really changed much recently, her weight today is 158 lb. [name] states pt does not c/o any sob, dyspnea, though she does state pt's abdomin is distended and does have trace ankle edema though the edema is not necessarily new. BP today was 12/68 HR 84 SPO2 96%. I advised RN that I will d/w further with Richardson Dopp, PA today as to pt's weight and call if we have any further recommendations. Pt does have an appt with Dr. Harrington Challenger 09/02/17 @ 9:20. I have scheduled pt for her repeat FASTING lab work to be done on November 15, 2017 to recheck cholesterol. [name] thanked me for the call and the update today as to the lab results and medication changes.

## 2017-08-17 NOTE — Telephone Encounter (Signed)
DPR ok to s/w son and granddaughter. I left a message for pt's son Harlon Flor to call me back so that I may go over lab results and recommendations as to change in cholesterol medication.

## 2017-08-17 NOTE — Telephone Encounter (Signed)
Pt's son called and has been notified of lab results for the pt. Pt does reside at [facility]. I left message for nurse to please call back so that I may go over medication changes as well as lab work to be done in 3 months to recheck lipid and liver function. Ph# to [employer] is [phone].

## 2017-08-20 ENCOUNTER — Telehealth: Payer: Self-pay | Admitting: *Deleted

## 2017-08-20 DIAGNOSIS — I5022 Chronic systolic (congestive) heart failure: Secondary | ICD-10-CM

## 2017-08-20 LAB — BASIC METABOLIC PANEL
BUN/Creatinine Ratio: 28 (ref 12–28)
BUN: 33 mg/dL (ref 10–36)
CALCIUM: 10.1 mg/dL (ref 8.7–10.3)
CO2: 20 mmol/L (ref 20–29)
CREATININE: 1.18 mg/dL — AB (ref 0.57–1.00)
Chloride: 110 mmol/L — ABNORMAL HIGH (ref 96–106)
GFR calc non Af Amer: 40 mL/min/{1.73_m2} — ABNORMAL LOW (ref >59)
GFR, EST AFRICAN AMERICAN: 46 mL/min/{1.73_m2} — AB (ref >59)
Glucose: 184 mg/dL — ABNORMAL HIGH (ref 65–99)
Potassium: 5.9 mmol/L — ABNORMAL HIGH (ref 3.5–5.2)
SODIUM: 150 mmol/L — AB (ref 134–144)

## 2017-08-20 LAB — SPECIMEN STATUS REPORT

## 2017-08-20 NOTE — Telephone Encounter (Signed)
Left message for the RN with Christine [name] in the Home Care area [phone] to please call our office to discuss medications changes and recommendations.

## 2017-08-20 NOTE — Telephone Encounter (Signed)
-----   Message from Liliane Shi, [place] sent at 08/20/2017  8:20 AM EST ----- Please call the patient Glucose elevated.  Creatinine stable.  Potassium and sodium elevated. Notes in chart indicate the patient is now on Lasix 40 mg daily and potassium 10 mEq daily. PLAN:  1. Stop potassium. 2. Hold Lasix 3. Check BMET Monday, 08/23/17 4. She needs to go to the ED this weekend if she develops any mental status changes. Richardson Dopp, PA-C 08/20/2017 8:14 AM

## 2017-08-20 NOTE — Telephone Encounter (Signed)
I s/w Maple Mirza, RN in the [facility] with [facility]. I reviewed results and medication recommendations with RN. [name] states to me that pt is not taking any K+ supplement. RN states pt is taking Lasix 20 mg every Mon, Wed and Fri's. RN does state pt does have banana's in her room. I advised will need to decrease dietary K+ and HOLD Lasix. Pt will need to come in 08/23/17 for lab work (bmet) to be done. I will reflect pt's chart as to the current dose of lasix per Maple Mirza, RN at [facility]. I will forward this to Richardson Dopp, PA-C as well. RN does state pt's weight today is 156 lb, where yesterday's weight was 160 lb. [name] verbalized understanding to plan of care for the pt. I will fax over lab results and recommendations to Maple Mirza, RN fax # [phone].

## 2017-08-23 ENCOUNTER — Other Ambulatory Visit: Payer: Medicare Other

## 2017-08-23 DIAGNOSIS — I5022 Chronic systolic (congestive) heart failure: Secondary | ICD-10-CM

## 2017-08-23 LAB — BASIC METABOLIC PANEL
BUN/Creatinine Ratio: 21 (ref 12–28)
BUN: 25 mg/dL (ref 10–36)
CALCIUM: 9.3 mg/dL (ref 8.7–10.3)
CO2: 22 mmol/L (ref 20–29)
CREATININE: 1.17 mg/dL — AB (ref 0.57–1.00)
Chloride: 104 mmol/L (ref 96–106)
GFR calc Af Amer: 46 mL/min/{1.73_m2} — ABNORMAL LOW (ref >59)
GFR, EST NON AFRICAN AMERICAN: 40 mL/min/{1.73_m2} — AB (ref >59)
Glucose: 255 mg/dL — ABNORMAL HIGH (ref 65–99)
Potassium: 5.1 mmol/L (ref 3.5–5.2)
SODIUM: 139 mmol/L (ref 134–144)

## 2017-08-24 ENCOUNTER — Telehealth: Payer: Self-pay | Admitting: *Deleted

## 2017-08-24 MED ORDER — GLIMEPIRIDE 2 MG PO TABS: 2.0000 mg | ORAL_TABLET | Freq: Every day | ORAL | Status: DC

## 2017-08-24 NOTE — Telephone Encounter (Signed)
-----   Message from Michae Kava, [place] sent at 08/24/2017  1:22 PM EST -----   ----- Message ----- From: Michae Kava, CMA Sent: 08/24/2017   1:21 PM To: Lajean Manes, MD  Tried 2 more times to reach RN at [facility] to review pt's lab results and medication adjustments. No answer.

## 2017-08-24 NOTE — Telephone Encounter (Signed)
-----   Message from Liliane Shi, [place] sent at 08/24/2017  7:58 AM EST ----- Please call the patient The sodium and potassium are normal.  The creatinine is stable.  The sugar is too high. Patient was on Lasix 20 mg every Mon, Wed, Fri >> please change Lasix to 20 mg twice a week.  Ok to take 20 mg extra if weight increases by 3 lbs or more in 1 day.  Repeat BMET in 2 weeks. She should follow up with PCP for diabetes.   Richardson Dopp, PA-C    08/24/2017 7:58 AM

## 2017-08-24 NOTE — Telephone Encounter (Signed)
I s/w Maple Mirza, RN at [facility] in regards to pt's lab results and recommendations. Advised to change lasix 20 to twice weekly, extra lasix 20 mg if weight is up 3 lb's or more x 1 day. BMET in 2 weeks, though pt does have appt with Dr. Harrington Challenger 09/02/17 @ 9:20, Dr. Harrington Challenger may just repeat bmet at appt at that time. RN does state pt's weight today was up from 157 on 1/14 to 160 1/15 (today). RN states she will give pt extra dose today and hold lasix tomorrow and will either give lasix 20 mg this Thursday or Friday. Maple Mirza, RN states she will call the office and let us know if her weight is not going down. RN does state pt feels she may be somewhat constipated and has taken Milk of Mag yesterday with some results today. RN advised pt to follow up with PCP about diabetes. [name] did also state pt is not on Tradjenta any longer and is on Glimepiride 2 mg every morning. I will make change in medication list to show correct medications for the pt.

## 2017-08-24 NOTE — Telephone Encounter (Signed)
Tried 2 more times to reach RN at [employer] to review pt's lab results and medication adjustments. No answer.

## 2017-08-24 NOTE — Telephone Encounter (Signed)
I tried to reach Maple Mirza, RN for the pt, though no answer. I will try again later today.

## 2017-08-26 ENCOUNTER — Telehealth: Payer: Self-pay | Admitting: Internal Medicine

## 2017-08-26 NOTE — Telephone Encounter (Signed)
New Message   Christine [name] is returning call from [facility] about a rx that was to be sent to the pharmacy. Please call

## 2017-08-26 NOTE — Telephone Encounter (Signed)
Reviewed with Julaine Hua, CMA She had prescription ready to be faxed to facility for lasix. Faxed to [phone], [facility].

## 2017-09-02 ENCOUNTER — Telehealth: Payer: Self-pay | Admitting: Internal Medicine

## 2017-09-02 ENCOUNTER — Ambulatory Visit (INDEPENDENT_AMBULATORY_CARE_PROVIDER_SITE_OTHER): Payer: Medicare Other | Admitting: Internal Medicine

## 2017-09-02 ENCOUNTER — Encounter: Payer: Self-pay | Admitting: Internal Medicine

## 2017-09-02 VITALS — BP 140/58 | HR 67 | Ht 63.0 in | Wt 163.0 lb

## 2017-09-02 DIAGNOSIS — I5023 Acute on chronic systolic (congestive) heart failure: Secondary | ICD-10-CM

## 2017-09-02 DIAGNOSIS — I5022 Chronic systolic (congestive) heart failure: Secondary | ICD-10-CM

## 2017-09-02 NOTE — Progress Notes (Signed)
Cardiology Office Note   Date:  09/02/2017   ID:  Christine [name], DOB Apr 08, 1922, MRN [id number]  PCP:  Christine Manes, MD  Cardiologist:   Christine Carnes, MD   Pt presents for eval of SOB and systolic CHF    History of Present Illness: Christine [name] is a 84 y.o. female with a history of Systolic CHF and probable CAD  Episode last summer (2018) concerning for MI  Echo on 8/20 with severe LV dysfunctoin and regional wall motion abnormalities    I saw her twice during the summer  She was reluctant to start meds for CHF   Seen by Kathleen Argue this past fall.  Again, refused meds  The pt lives at [facility] in [place] .  They called to say the pts wt was up  I recomm some changes to lasix dosing with additional doses being given and ovearll 20 mg 3x per week scheduled  The pt says her breathing is OK   Denies CP    Does have some edema    More concerned with abdominal size  Wants to lose wt.    Allergies:   Patient has no known allergies.   Past Medical History:  Diagnosis Date  . Arthritis   . Chronic systolic CHF (congestive heart failure) ([place]) 05/17/2017   Echo 8/18 - EF 20-25, severe diffuse HK with anteroseptal, anterior, apical AK, Doppler parameters c/w restrictive physio, no evidence of thrombus, mild to mod MR, mild LAE, PASP 33, trivial effusion, L pleural effusion  . Dilated cardiomyopathy ([place]) 05/17/2017   probably ischemic; presumed CAD given wall motion abnl on Echo 8/18  NIDDM  Past Surgical History:  Procedure Laterality Date  . ANTERIOR APPROACH HEMI HIP ARTHROPLASTY Left 03/28/2016   Procedure: ANTERIOR APPROACH HEMI HIP ARTHROPLASTY;  Surgeon: Dorna Leitz, MD;  Location: [facility];  Service: Orthopedics;  Laterality: Left;     Social History:  The patient  No history of drinking  REmote tobacco   Family History:  The patient's  Dad had heart trouble  Parents died in 75s    ROS:  Please see the history of present illness. All other systems are reviewed and   Negative to the above problem except as noted.    PHYSICAL EXAM: VS:  BP (!) 140/58   Pulse 67   Ht 5\' 3"  (1.6 m)   Wt 163 lb (73.9 kg)   SpO2 94%   BMI 28.87 kg/m   GEN: Well nourished, well developed, in no acute distress  HEENT: normal  Neck: JVP prominent, carotid bruits, or masses Cardiac: RRR; no murmurs, rubs, or gallops,NO edema  Respiratory: Clear   GI: soft, nontender, nondistended, + BS  No hepatomegaly  MS: no deformity Moving all extremities   Skin: warm and dry, no rash Neuro:  Strength and sensation are intact Psych: euthymic mood, full affect   EKG:  Not done     Lipid Panel    Component Value Date/Time   CHOL 191 08/16/2017 0929   TRIG 66 08/16/2017 0929   HDL 57 08/16/2017 0929   CHOLHDL 3.4 08/16/2017 0929   LDLCALC 121 (H) 08/16/2017 0929      Wt Readings from Last 3 Encounters:  09/02/17 163 lb (73.9 kg)  06/08/17 150 lb (68 kg)  05/17/17 150 lb 6.4 oz (68.2 kg)      ASSESSMENT AND PLAN:  1  Acute on chronic systolic CHF  LVEF 20 KC12% with regional wall motion abnormalities  Pt's volume status is mildly increase   Would recomm labs today before making any changes in meds   Will check lipids today  Should be on a statin    Plan for conservative Rx with medical Rx  Hx supports pt had MI last summer   Denies CP now  Plan for conservative Rx.   Current medicines are reviewed at length with the patient today.  The patient does not have concerns regarding medicines.  Signed, Christine Carnes, MD  09/02/2017 9:51 AM    [address], [place], [place]  [address] Phone: [phone]; Fax: [phone]

## 2017-09-02 NOTE — Patient Instructions (Addendum)
Medication Instructions:  Your physician recommends that you continue on your current medications as directed. Please refer to the Current Medication list given to you today.   Labwork: Lab work to be done today--BMP, BNP, CBC  Testing/Procedures: none  Follow-Up: Your physician recommends that you schedule a follow-up appointment in: 3 months.  Scheduled for May 3,2019 at 9:20    Any Other Special Instructions Will Be Listed Below (If Applicable).     If you need a refill on your cardiac medications before your next appointment, please call your pharmacy.

## 2017-09-02 NOTE — Telephone Encounter (Signed)
New message   Christine [name] is calling because pt had appt today and her meds were changed Please call

## 2017-09-02 NOTE — Telephone Encounter (Signed)
Spoke with patient's (RN caretaker) [name].  I informed her of the most recent Lasix instructions:  Take 20mg  daily by mouth  twice per week and if weight increases by 3 lbs in 1 or more days, add another 20mg .  She verbalized understanding.

## 2017-09-03 LAB — CBC
HEMOGLOBIN: 13.2 g/dL (ref 11.1–15.9)
Hematocrit: 40.3 % (ref 34.0–46.6)
MCH: 30.5 pg (ref 26.6–33.0)
MCHC: 32.8 g/dL (ref 31.5–35.7)
MCV: 93 fL (ref 79–97)
Platelets: 522 10*3/uL — ABNORMAL HIGH (ref 150–379)
RBC: 4.33 x10E6/uL (ref 3.77–5.28)
RDW: 13.4 % (ref 12.3–15.4)
WBC: 8.4 10*3/uL (ref 3.4–10.8)

## 2017-09-03 LAB — BASIC METABOLIC PANEL
BUN/Creatinine Ratio: 20 (ref 12–28)
BUN: 20 mg/dL (ref 10–36)
CO2: 22 mmol/L (ref 20–29)
CREATININE: 1.02 mg/dL — AB (ref 0.57–1.00)
Calcium: 9.6 mg/dL (ref 8.7–10.3)
Chloride: 105 mmol/L (ref 96–106)
GFR calc Af Amer: 55 mL/min/{1.73_m2} — ABNORMAL LOW (ref >59)
GFR, EST NON AFRICAN AMERICAN: 48 mL/min/{1.73_m2} — AB (ref >59)
Glucose: 284 mg/dL — ABNORMAL HIGH (ref 65–99)
Potassium: 5.1 mmol/L (ref 3.5–5.2)
SODIUM: 142 mmol/L (ref 134–144)

## 2017-09-03 LAB — PRO B NATRIURETIC PEPTIDE: NT-Pro BNP: 2069 pg/mL — ABNORMAL HIGH (ref 0–738)

## 2017-09-07 MED ORDER — POTASSIUM CHLORIDE ER 10 MEQ PO TBCR: EXTENDED_RELEASE_TABLET | ORAL | 6 refills | Status: DC

## 2017-09-07 MED ORDER — FUROSEMIDE 20 MG PO TABS: ORAL_TABLET | ORAL | 6 refills | Status: DC

## 2017-09-07 NOTE — Telephone Encounter (Addendum)
Notes recorded by Fay Records, MD on 09/03/2017 at 11:48 AM EST CBC is OK Fluid is up I would recomm taking lasix 20 mg alternating with 40 mg   Take 10 KCL on days taking 40 mg F?U BMET and BNP in 2 wks   Notes recorded by Rodman Key, RN on 09/07/2017 at 11:31 AM EST I spoke to [name], Therapist, sports at [facility] to review labs and recommendations. Pt will take 40 mg lasix every MWF and 20 mg all other days.  She will take 10 meq potassium on MWF. [name] requested med changes to [facility]. Will also forward labs and recommendations to [place] [phone]. She is aware patient needs labs (BMET and BNP) in 2 weeks. Appointment made for 09/22/17.

## 2017-09-17 ENCOUNTER — Telehealth: Payer: Self-pay | Admitting: Internal Medicine

## 2017-09-17 NOTE — Telephone Encounter (Signed)
Christine [name] [facility] ) is calling because Christine [name] weight is going up, she started at 158 and now she is at 161 today ,  blood pressure is higher than usual , she has a little more edema than usual its like a plus 1 . Please call

## 2017-09-17 NOTE — Telephone Encounter (Signed)
Follow up   Christine [name] is calling back. Please call to discuss.

## 2017-09-17 NOTE — Telephone Encounter (Signed)
On [facility] scales: Weight of 161 was this afternoon.   Most recently 157-158.  December 150-154. BP 160/62 Legs swollen Crackles left base No SOB Been sleeping on 1 pillow Eats in dining room. Going to activities-chair yoga Doesn't always eat low salt.  Dose of lasix is MWF 40 mg and all other days 20 mg. Advised to take Lasix 40 mg instead of 20 mg tomorrow and moved up labs (BMET, BNP) from [facility] to [facility] 2/11.    Will route to Dr. Harrington Challenger to update/for any further recommendations.

## 2017-09-19 NOTE — Telephone Encounter (Signed)
Take 40 mg of lasix daily

## 2017-09-20 ENCOUNTER — Other Ambulatory Visit: Payer: Medicare Other | Admitting: *Deleted

## 2017-09-20 DIAGNOSIS — I5022 Chronic systolic (congestive) heart failure: Secondary | ICD-10-CM

## 2017-09-21 LAB — BASIC METABOLIC PANEL
BUN / CREAT RATIO: 21 (ref 12–28)
BUN: 26 mg/dL (ref 10–36)
CO2: 25 mmol/L (ref 20–29)
CREATININE: 1.26 mg/dL — AB (ref 0.57–1.00)
Calcium: 9.7 mg/dL (ref 8.7–10.3)
Chloride: 100 mmol/L (ref 96–106)
GFR calc Af Amer: 42 mL/min/{1.73_m2} — ABNORMAL LOW (ref >59)
GFR calc non Af Amer: 37 mL/min/{1.73_m2} — ABNORMAL LOW (ref >59)
GLUCOSE: 197 mg/dL — AB (ref 65–99)
Potassium: 5.2 mmol/L (ref 3.5–5.2)
SODIUM: 142 mmol/L (ref 134–144)

## 2017-09-21 LAB — PRO B NATRIURETIC PEPTIDE: NT-PRO BNP: 2802 pg/mL — AB (ref 0–738)

## 2017-09-21 MED ORDER — FUROSEMIDE 40 MG PO TABS: 40.0000 mg | ORAL_TABLET | Freq: Every day | ORAL | 6 refills | Status: DC

## 2017-09-21 MED ORDER — FUROSEMIDE 20 MG PO TABS: 40.0000 mg | ORAL_TABLET | Freq: Every day | ORAL | 6 refills | Status: DC

## 2017-09-21 NOTE — Telephone Encounter (Signed)
Late entry for 09/20/17: 6:15 pm  Faxed prescription to [facility], attn: Maple Mirza, RN to change lasix to 40 mg daily. (Fax: [phone])    Sent prescription to [facility] by [facility] at this time.

## 2017-09-22 ENCOUNTER — Other Ambulatory Visit: Payer: Medicare Other

## 2017-09-29 ENCOUNTER — Other Ambulatory Visit: Payer: Medicare Other | Admitting: *Deleted

## 2017-09-29 DIAGNOSIS — E785 Hyperlipidemia, unspecified: Secondary | ICD-10-CM

## 2017-09-29 LAB — LIPID PANEL
Chol/HDL Ratio: 3 ratio (ref 0.0–4.4)
Cholesterol, Total: 148 mg/dL (ref 100–199)
HDL: 50 mg/dL (ref >39)
LDL Calculated: 78 mg/dL (ref 0–99)
TRIGLYCERIDES: 101 mg/dL (ref 0–149)
VLDL Cholesterol Cal: 20 mg/dL (ref 5–40)

## 2017-09-29 LAB — HEPATIC FUNCTION PANEL
ALK PHOS: 63 IU/L (ref 39–117)
ALT: 16 IU/L (ref 0–32)
AST: 15 IU/L (ref 0–40)
Albumin: 4 g/dL (ref 3.2–4.6)
Bilirubin Total: 0.3 mg/dL (ref 0.0–1.2)
Bilirubin, Direct: 0.12 mg/dL (ref 0.00–0.40)
Total Protein: 6.4 g/dL (ref 6.0–8.5)

## 2017-10-07 ENCOUNTER — Ambulatory Visit (INDEPENDENT_AMBULATORY_CARE_PROVIDER_SITE_OTHER): Payer: Medicare Other | Admitting: Internal Medicine

## 2017-10-07 ENCOUNTER — Encounter: Payer: Self-pay | Admitting: Internal Medicine

## 2017-10-07 VITALS — BP 130/80 | HR 72 | Ht 63.0 in | Wt 166.4 lb

## 2017-10-07 DIAGNOSIS — I5022 Chronic systolic (congestive) heart failure: Secondary | ICD-10-CM

## 2017-10-07 DIAGNOSIS — E785 Hyperlipidemia, unspecified: Secondary | ICD-10-CM

## 2017-10-07 DIAGNOSIS — I2510 Atherosclerotic heart disease of native coronary artery without angina pectoris: Secondary | ICD-10-CM | POA: Diagnosis not present

## 2017-10-07 NOTE — Progress Notes (Signed)
Cardiology Office Note   Date:  10/07/2017   ID:  Christine [name], DOB 12/23/1921, MRN [id number]  PCP:  Lajean Manes, MD  Cardiologist:   Dorris Carnes, MD   Pt presents for eval of SOB and systolic CHF    History of Present Illness: Christine [name] is a 84 y.o. female with a history of Systolic CHF and probable CAD  Episode last summer (2018) concerning for MI  Echo on 8/20 with severe LV dysfunctoin and regional wall motion abnormalities    I saw her twice during the summer  She was reluctant to start meds for CHF   Seen by Kathleen Argue this past fall.  Again, refused meds  I saw the pt in clinic in January  At that time voluem was mildly increased  Plan for conservative Rx  Lasix adjusted  Labs repeated and lasix changed to 40 daily   K supplement stoppped on 2/28  Since seen the  pt says her breathing is OK   Denies CP    Does have occasional edema    Allergies:   Patient has no known allergies.   Past Medical History:  Diagnosis Date  . Arthritis   . Chronic systolic CHF (congestive heart failure) ([place]) 05/17/2017   Echo 8/18 - EF 20-25, severe diffuse HK with anteroseptal, anterior, apical AK, Doppler parameters c/w restrictive physio, no evidence of thrombus, mild to mod MR, mild LAE, PASP 33, trivial effusion, L pleural effusion  . Dilated cardiomyopathy ([place]) 05/17/2017   probably ischemic; presumed CAD given wall motion abnl on Echo 8/18  NIDDM  Past Surgical History:  Procedure Laterality Date  . ANTERIOR APPROACH HEMI HIP ARTHROPLASTY Left 03/28/2016   Procedure: ANTERIOR APPROACH HEMI HIP ARTHROPLASTY;  Surgeon: Dorna Leitz, MD;  Location: [facility];  Service: Orthopedics;  Laterality: Left;     Social History:  The patient  No history of drinking  REmote tobacco   Family History:  The patient's  Dad had heart trouble  Parents died in 30s    ROS:  Please see the history of present illness. All other systems are reviewed and  Negative to the above problem except  as noted.    PHYSICAL EXAM: VS:  BP 130/80   Pulse 72   Ht 5\' 3"  (1.6 m)   Wt 166 lb 6.4 oz (75.5 kg)   SpO2 98%   BMI 29.48 kg/m   GEN: Well nourished, well developed, in no acute distress  HEENT: normal  Neck: JVP normal  No carotid bruits, or masses Cardiac: RRR; no murmurs, rubs, or gallops,NO edema  Respiratory: Clear   GI: soft, nontender, nondistended, + BS  No hepatomegaly  MS: no deformity Moving all extremities   Skin: warm and dry, no rash Neuro:  Strength and sensation are intact Psych: euthymic mood, full affect   EKG:  EKG is not done      Lipid Panel    Component Value Date/Time   CHOL 148 09/29/2017 0937   TRIG 101 09/29/2017 0937   HDL 50 09/29/2017 0937   CHOLHDL 3.0 09/29/2017 0937   LDLCALC 78 09/29/2017 0937      Wt Readings from Last 3 Encounters:  10/07/17 166 lb 6.4 oz (75.5 kg)  09/02/17 163 lb (73.9 kg)  06/08/17 150 lb (68 kg)      ASSESSMENT AND PLAN:  1  Acute on chronic systolic CHF  LVEF 20 YJ85% with regional wall motion abnormalities  Pt's volume status  Looks pretty good She appears comfortable    2  Probable CAD  Pt without angina  Follow    3  HL  Lpids not bad  No changes   F/U in 2 months    Current medicines are reviewed at length with the patient today.  The patient does not have concerns regarding medicines.  Signed, Dorris Carnes, MD  10/07/2017 2:50 PM    [facility] [address], [place], [place]  [address] Phone: [phone]; Fax: [phone]

## 2017-10-07 NOTE — Patient Instructions (Signed)
Medication Instructions:  Your physician recommends that you continue on your current medications as directed. Please refer to the Current Medication list given to you today.   Labwork: Your physician recommends that you return for lab work today (bmet, bnp)   Testing/Procedures: none  Follow-Up: 2 months with Richardson Dopp, PA-C  Any Other Special Instructions Will Be Listed Below (If Applicable).     If you need a refill on your cardiac medications before your next appointment, please call your pharmacy.

## 2017-10-08 LAB — BASIC METABOLIC PANEL
BUN/Creatinine Ratio: 24 (ref 12–28)
BUN: 34 mg/dL (ref 10–36)
CO2: 25 mmol/L (ref 20–29)
CREATININE: 1.43 mg/dL — AB (ref 0.57–1.00)
Calcium: 9.8 mg/dL (ref 8.7–10.3)
Chloride: 101 mmol/L (ref 96–106)
GFR calc Af Amer: 36 mL/min/{1.73_m2} — ABNORMAL LOW (ref >59)
GFR calc non Af Amer: 32 mL/min/{1.73_m2} — ABNORMAL LOW (ref >59)
GLUCOSE: 258 mg/dL — AB (ref 65–99)
POTASSIUM: 5.6 mmol/L — AB (ref 3.5–5.2)
Sodium: 141 mmol/L (ref 134–144)

## 2017-10-08 LAB — PRO B NATRIURETIC PEPTIDE: NT-PRO BNP: 2885 pg/mL — AB (ref 0–738)

## 2017-10-15 ENCOUNTER — Other Ambulatory Visit: Payer: Self-pay | Admitting: *Deleted

## 2017-10-15 DIAGNOSIS — E875 Hyperkalemia: Secondary | ICD-10-CM

## 2017-10-15 DIAGNOSIS — I5022 Chronic systolic (congestive) heart failure: Secondary | ICD-10-CM

## 2017-10-15 NOTE — Progress Notes (Signed)
Left detailed message on nurse Christiane Ha voicemail of changes and that I would fax the orders. I faxed orders [phone]) to stop potassium and re draw bmet on 10/28/17 per Dr. Harrington Challenger.

## 2017-10-19 ENCOUNTER — Other Ambulatory Visit: Payer: Self-pay | Admitting: *Deleted

## 2017-10-19 ENCOUNTER — Telehealth: Payer: Self-pay | Admitting: Internal Medicine

## 2017-10-19 MED ORDER — METOPROLOL SUCCINATE ER 25 MG PO TB24: 25.0000 mg | ORAL_TABLET | Freq: Every day | ORAL | 10 refills | Status: AC

## 2017-10-19 NOTE — Telephone Encounter (Signed)
New message     *STAT* If patient is at the pharmacy, call can be transferred to refill team.   1. Which medications need to be refilled? (please list name of each medication and dose if known) metoprolol succinate (TOPROL XL) 25 MG 24 hr tablet  2. Which pharmacy/location (including street and city if local pharmacy) is medication to be sent to?[place], [place]  3. Do they need a 30 day or 90 day supply? [place]

## 2017-10-20 ENCOUNTER — Telehealth: Payer: Self-pay | Admitting: Internal Medicine

## 2017-10-20 NOTE — Telephone Encounter (Signed)
New message     Christine [name] received the fax and everything looks good, could you please fax her a PRN  Order for the Lasik , that you sent over a couple months ago    Fax [phone]

## 2017-10-21 NOTE — Telephone Encounter (Signed)
Faxed order to DC prn dose of lasix 20 mg.to [facility], attn: Maple Mirza [phone]. Pt is taking lasix 40 daily. Has labs again on 10/28/17

## 2017-10-28 ENCOUNTER — Telehealth: Payer: Self-pay | Admitting: *Deleted

## 2017-10-28 ENCOUNTER — Other Ambulatory Visit: Payer: Medicare Other | Admitting: *Deleted

## 2017-10-28 DIAGNOSIS — I5022 Chronic systolic (congestive) heart failure: Secondary | ICD-10-CM

## 2017-10-28 DIAGNOSIS — E875 Hyperkalemia: Secondary | ICD-10-CM

## 2017-10-28 NOTE — Telephone Encounter (Signed)
Received fax from Maple Mirza, RN at [facility] reporting: pt checking own weight every day in March and recorded 160-162 each day except:  164 3/7 165 3/20 164 today (after breakfast) Has usual trace edema, chest clear, BP 124/58, HR 76, regular.  Denies SOB orthopnea or abd fullness.    Has labs ordered for today,  Labs again 4/8l/19 F/u appt with PR on 5/3.  I will route this information to Dr. Harrington Challenger.

## 2017-10-29 LAB — BASIC METABOLIC PANEL
BUN/Creatinine Ratio: 21 (ref 12–28)
BUN: 24 mg/dL (ref 10–36)
CHLORIDE: 101 mmol/L (ref 96–106)
CO2: 24 mmol/L (ref 20–29)
CREATININE: 1.16 mg/dL — AB (ref 0.57–1.00)
Calcium: 9.5 mg/dL (ref 8.7–10.3)
GFR calc Af Amer: 47 mL/min/{1.73_m2} — ABNORMAL LOW (ref >59)
GFR calc non Af Amer: 41 mL/min/{1.73_m2} — ABNORMAL LOW (ref >59)
GLUCOSE: 288 mg/dL — AB (ref 65–99)
POTASSIUM: 5.2 mmol/L (ref 3.5–5.2)
SODIUM: 140 mmol/L (ref 134–144)

## 2017-11-15 ENCOUNTER — Other Ambulatory Visit: Payer: Medicare Other

## 2017-12-06 ENCOUNTER — Encounter: Payer: Self-pay | Admitting: Physician Assistant

## 2017-12-06 ENCOUNTER — Ambulatory Visit (INDEPENDENT_AMBULATORY_CARE_PROVIDER_SITE_OTHER): Payer: Medicare Other | Admitting: Physician Assistant

## 2017-12-06 VITALS — BP 110/60 | HR 83 | Ht 63.0 in | Wt 162.8 lb

## 2017-12-06 DIAGNOSIS — E785 Hyperlipidemia, unspecified: Secondary | ICD-10-CM | POA: Diagnosis not present

## 2017-12-06 DIAGNOSIS — N183 Chronic kidney disease, stage 3 unspecified: Secondary | ICD-10-CM | POA: Insufficient documentation

## 2017-12-06 DIAGNOSIS — L299 Pruritus, unspecified: Secondary | ICD-10-CM

## 2017-12-06 DIAGNOSIS — I5022 Chronic systolic (congestive) heart failure: Secondary | ICD-10-CM

## 2017-12-06 NOTE — Progress Notes (Signed)
Cardiology Office Note:    Date:  12/06/2017   ID:  Christine [name], DOB 05-16-1922, MRN [id number]  PCP:  Lajean Manes, MD  Cardiologist:  Dorris Carnes, MD   Referring MD: Lajean Manes, MD   Chief Complaint  Patient presents with  . Follow-up    CHF    History of Present Illness:    Christine [name] is a 84 y.o. female with systolic HF, probable CAD, diabetes. She was initially seen by Dr. Dorris Carnes in 03/2017. She had what sounded like an myocardial infarction several weeks prior. EF is 20-25 on echocardiogram.  She has been managed conservatively due to advanced age and risk for complications.  She resides at [facility] assisted living facility.  She was last seen by Dr. Harrington Challenger February 2019.  Christine [name] returns for follow-up.  She is here alone.  Since last seen, she has been doing well.  She brings in a detailed list of her weights.  They have been stable (160 pounds) without change.  She denies chest discomfort, significant shortness of breath, PND, syncope.  She does note some mild pedal edema.  Prior CV studies:   The following studies were reviewed today:  Echocardiogram 03/29/17 EF 20-25, severe diffuse HK with anteroseptal, anterior, apical akinesis, Doppler parameters consistent with restrictive physiology, no evidence of thrombus, mild to moderate MR, mild LAE, PASP 33, trivial effusion, left pleural effusion   Past Medical History:  Diagnosis Date  . Arthritis   . Chronic systolic CHF (congestive heart failure) ([place]) 05/17/2017   Echo 8/18 - EF 20-25, severe diffuse HK with anteroseptal, anterior, apical AK, Doppler parameters c/w restrictive physio, no evidence of thrombus, mild to mod MR, mild LAE, PASP 33, trivial effusion, L pleural effusion  . Dilated cardiomyopathy ([place]) 05/17/2017   probably ischemic; presumed CAD given wall motion abnl on Echo 8/18   Surgical Hx: The patient  has a past surgical history that includes Anterior approach hemi hip  arthroplasty (Left, 03/28/2016).   Current Medications: Current Meds  Medication Sig  . aspirin EC 81 MG tablet Take 81 mg by mouth daily.  . furosemide (LASIX) 40 MG tablet Take 1 tablet (40 mg total) by mouth daily.  Marland Kitchen glimepiride (AMARYL) 2 MG tablet Take 1 tablet (2 mg total) by mouth daily with breakfast.  . metoprolol succinate (TOPROL XL) 25 MG 24 hr tablet Take 1 tablet (25 mg total) by mouth daily.  Marland Kitchen PRESCRIPTION MEDICATION Take 1 tablet by mouth 2 (two) times daily. Per patients son she is on a medication for diabetes but he doesn't know what it is,neither of them know her pharmacy and the nursing home will not answer the phone.  . sacubitril-valsartan (ENTRESTO) 24-26 MG Take 1 tablet by mouth 2 (two) times daily.     Allergies:   Patient has no known allergies.   Social History   Tobacco Use  . Smoking status: Never Smoker  . Smokeless tobacco: Never Used  Substance Use Topics  . Alcohol use: No  . Drug use: No     Family Hx: The patient's Family history is unknown by patient.  ROS:   Please see the history of present illness.    Review of Systems  Skin: Positive for itching.   All other systems reviewed and are negative.   EKGs/Labs/Other Test Reviewed:    EKG:  EKG is not ordered today.    Recent Labs: 03/29/2017: TSH 4.260 09/02/2017: Hemoglobin 13.2; Platelets 522 09/29/2017: ALT 16 10/07/2017:  NT-Pro BNP 2,885 10/28/2017: BUN 24; Creatinine, Ser 1.16; Potassium 5.2; Sodium 140   Recent Lipid Panel Lab Results  Component Value Date/Time   CHOL 148 09/29/2017 09:37 AM   TRIG 101 09/29/2017 09:37 AM   HDL 50 09/29/2017 09:37 AM   CHOLHDL 3.0 09/29/2017 09:37 AM   LDLCALC 78 09/29/2017 09:37 AM    Physical Exam:    VS:  BP 110/60 (BP Location: Right Arm, Patient Position: Sitting)   Pulse 83   Ht 5\' 3"  (1.6 m)   Wt 162 lb 12.8 oz (73.8 kg)   SpO2 96%   BMI 28.84 kg/m     Wt Readings from Last 3 Encounters:  12/06/17 162 lb 12.8 oz (73.8 kg)    10/07/17 166 lb 6.4 oz (75.5 kg)  09/02/17 163 lb (73.9 kg)     Physical Exam  Constitutional: She is oriented to person, place, and time. She appears well-developed and well-nourished. No distress.  HENT:  Head: Normocephalic and atraumatic.  Neck: Neck supple.  Cardiovascular: Normal rate, regular rhythm, S1 normal and S2 normal.  Murmur heard.  Low-pitched early systolic murmur is present with a grade of 1/6 at the upper left sternal border. Pulmonary/Chest: Effort normal. She has no rales.  Abdominal: Soft.  Musculoskeletal: She exhibits edema (very trace edema bilat LE).  Neurological: She is alert and oriented to person, place, and time.  Skin: Skin is warm and dry.    ASSESSMENT & PLAN:    #1.  Chronic systolic CHF (congestive heart failure) ([place]) Presumed ischemic cardiomyopathy.  EF 20-25.  Volume status is stable.  Continue current regimen which includes furosemide, metoprolol, Entresto.  #2.  CKD (chronic kidney disease) stage 3, GFR 30-59 ml/min (HCC) Recent creatinine improved.  #3.  Hyperlipidemia, unspecified hyperlipidemia type LDL optimal on most recent lab work.  Continue current Rx.    #4.  Pruritus She notes some mild, diffuse pruritus.  She question if it was related to medications.  It is possible that metoprolol could cause pruritus.  I have asked her to follow-up with primary care.  If no other causes are identified, we could consider changing her to carvedilol.   Dispo:  Return in about 3 months (around 03/07/2018) for Routine Follow Up, w/ Dr. Harrington Challenger, or Richardson Dopp, PA-C.   Medication Adjustments/Labs and Tests Ordered: Current medicines are reviewed at length with the patient today.  Concerns regarding medicines are outlined above.  Tests Ordered: No orders of the defined types were placed in this encounter.  Medication Changes: No orders of the defined types were placed in this encounter.   Signed, Richardson Dopp, PA-C  12/06/2017 9:53 AM     [facility] [address], [place], [place]  [address] Phone: [phone]; Fax: [phone]

## 2017-12-06 NOTE — Patient Instructions (Signed)
Medication Instructions:  Your physician recommends that you continue on your current medications as directed. Please refer to the Current Medication list given to you today.  Labwork: None ordered  Testing/Procedures: None ordered  Follow-Up: Your physician recommends that you schedule a follow-up appointment in: 3 MONTHS WITH DR. ROSS OR SCOTT WEAVER, PA-C   Any Other Special Instructions Will Be Listed Below (If Applicable).     If you need a refill on your cardiac medications before your next appointment, please call your pharmacy.

## 2017-12-10 ENCOUNTER — Ambulatory Visit: Payer: Medicare Other | Admitting: Internal Medicine

## 2018-03-13 NOTE — Progress Notes (Addendum)
Cardiology Office Note   Date:  03/14/2018   ID:  Christine [name], DOB June 22, 1922, MRN [id number]  PCP:  Lajean Manes, MD  Cardiologist:   Dorris Carnes, MD   Pt presents for F/U of CHF      History of Present Illness: Christine [name] is a 84 y.o. female with a history of Systolic CHF and probable CAD  Episode last summer (2018) concerning for MI  Echo on 8/20 with severe LV dysfunctoin and regional wall motion abnormalities    I saw her in clinic early this year  Volume status was good at time She was seen by Kathleen Argue in April    The pt denies CP   Breathing is OK   No dizziensss   Appetite is good   No palpitatons She says she takes her meds every day   Does not know what they are   Gets them filled at [facility]:   Patient has no known allergies.   Past Medical History:  Diagnosis Date  . Arthritis   . Chronic systolic CHF (congestive heart failure) ([place]) 05/17/2017   Echo 8/18 - EF 20-25, severe diffuse HK with anteroseptal, anterior, apical AK, Doppler parameters c/w restrictive physio, no evidence of thrombus, mild to mod MR, mild LAE, PASP 33, trivial effusion, L pleural effusion  . Dilated cardiomyopathy ([place]) 05/17/2017   probably ischemic; presumed CAD given wall motion abnl on Echo 8/18  NIDDM  Past Surgical History:  Procedure Laterality Date  . ANTERIOR APPROACH HEMI HIP ARTHROPLASTY Left 03/28/2016   Procedure: ANTERIOR APPROACH HEMI HIP ARTHROPLASTY;  Surgeon: Dorna Leitz, MD;  Location: [facility];  Service: Orthopedics;  Laterality: Left;     Social History:  The patient  No history of drinking  REmote tobacco   Family History:  The patient's  Dad had heart trouble  Parents died in 30s    ROS:  Please see the history of present illness. All other systems are reviewed and  Negative to the above problem except as noted.    PHYSICAL EXAM: VS:  BP 132/60 (BP Location: Right Arm, Patient Position: Sitting, Cuff Size: Normal)   Pulse 73   Ht 5'  3" (1.6 m)   Wt 163 lb 12.8 oz (74.3 kg)   SpO2 95%   BMI 29.02 kg/m   GEN: Overweight 85 yo no acute distress  HEENT: normal  Neck: JVP normal  No carotid bruits, or masses Cardiac: RRR; no murmurs, rubs, or gallops,NO edema  Respiratory: Clear   GI: soft, nontender, nondistended, + BS  No hepatomegaly  MS: no deformity Moving all extremities   Skin: warm and dry, no rash Neuro:  Strength and sensation are intact Psych: euthymic mood, full affect   EKG:  EKG is not done      Lipid Panel    Component Value Date/Time   CHOL 148 09/29/2017 0937   TRIG 101 09/29/2017 0937   HDL 50 09/29/2017 0937   CHOLHDL 3.0 09/29/2017 0937   LDLCALC 78 09/29/2017 0937      Wt Readings from Last 3 Encounters:  03/14/18 163 lb 12.8 oz (74.3 kg)  12/06/17 162 lb 12.8 oz (73.8 kg)  10/07/17 166 lb 6.4 oz (75.5 kg)      ASSESSMENT AND PLAN:  1  Chronic systolic CHF Echo showed  LVEF 20 to25% with regional wall motion abnormalities  Plan for medical Rx   Volume status today looks good  I  am not sure if she is taking meds   (A1C earlier this year was over 12)   Will check BMET and CBC today   Wll try to contact pharmacy at [facility] to see if getting meds   ? If someone could follow.  2  Probable CAD  Denies CP    3  HL LDL in Feb 78  HDL 50   This goes for taking meds   Will set f/u in 3 months      Current medicines are reviewed at length with the patient today.  The patient does not have concerns regarding medicines.  Signed, Dorris Carnes, MD  03/14/2018 10:15 AM    [address], [place], [place]  [address] Phone: [phone]; Fax: [phone]

## 2018-03-14 ENCOUNTER — Encounter: Payer: Self-pay | Admitting: Internal Medicine

## 2018-03-14 ENCOUNTER — Ambulatory Visit (INDEPENDENT_AMBULATORY_CARE_PROVIDER_SITE_OTHER): Payer: Medicare Other | Admitting: Internal Medicine

## 2018-03-14 ENCOUNTER — Encounter (INDEPENDENT_AMBULATORY_CARE_PROVIDER_SITE_OTHER): Payer: Self-pay

## 2018-03-14 VITALS — BP 132/60 | HR 73 | Ht 63.0 in | Wt 163.8 lb

## 2018-03-14 DIAGNOSIS — I2510 Atherosclerotic heart disease of native coronary artery without angina pectoris: Secondary | ICD-10-CM

## 2018-03-14 DIAGNOSIS — E1100 Type 2 diabetes mellitus with hyperosmolarity without nonketotic hyperglycemic-hyperosmolar coma (NKHHC): Secondary | ICD-10-CM

## 2018-03-14 DIAGNOSIS — I5043 Acute on chronic combined systolic (congestive) and diastolic (congestive) heart failure: Secondary | ICD-10-CM | POA: Diagnosis not present

## 2018-03-14 DIAGNOSIS — R7989 Other specified abnormal findings of blood chemistry: Secondary | ICD-10-CM

## 2018-03-14 DIAGNOSIS — E782 Mixed hyperlipidemia: Secondary | ICD-10-CM

## 2018-03-14 NOTE — Patient Instructions (Signed)
Your physician recommends that you continue on your current medications as directed. Please refer to the Current Medication list given to you today.  Your physician recommends that you return for lab work now: bmet, cbc, tsh, hgA1c  Your physician recommends that you schedule a follow-up appointment in: 3 months with Dr. Harrington Challenger.

## 2018-03-15 ENCOUNTER — Other Ambulatory Visit: Payer: Self-pay

## 2018-03-15 ENCOUNTER — Telehealth: Payer: Self-pay

## 2018-03-15 ENCOUNTER — Telehealth: Payer: Self-pay | Admitting: Internal Medicine

## 2018-03-15 DIAGNOSIS — E875 Hyperkalemia: Secondary | ICD-10-CM

## 2018-03-15 LAB — BASIC METABOLIC PANEL
BUN/Creatinine Ratio: 22 (ref 12–28)
BUN: 27 mg/dL (ref 10–36)
CALCIUM: 9.4 mg/dL (ref 8.7–10.3)
CO2: 22 mmol/L (ref 20–29)
CREATININE: 1.21 mg/dL — AB (ref 0.57–1.00)
Chloride: 100 mmol/L (ref 96–106)
GFR, EST AFRICAN AMERICAN: 45 mL/min/{1.73_m2} — AB (ref >59)
GFR, EST NON AFRICAN AMERICAN: 39 mL/min/{1.73_m2} — AB (ref >59)
Glucose: 224 mg/dL — ABNORMAL HIGH (ref 65–99)
POTASSIUM: 5.4 mmol/L — AB (ref 3.5–5.2)
Sodium: 135 mmol/L (ref 134–144)

## 2018-03-15 LAB — HEMOGLOBIN A1C
Est. average glucose Bld gHb Est-mCnc: 237 mg/dL
Hgb A1c MFr Bld: 9.9 % — ABNORMAL HIGH (ref 4.8–5.6)

## 2018-03-15 LAB — CBC
HEMATOCRIT: 39.2 % (ref 34.0–46.6)
HEMOGLOBIN: 13.5 g/dL (ref 11.1–15.9)
MCH: 31.3 pg (ref 26.6–33.0)
MCHC: 34.4 g/dL (ref 31.5–35.7)
MCV: 91 fL (ref 79–97)
Platelets: 518 10*3/uL — ABNORMAL HIGH (ref 150–450)
RBC: 4.32 x10E6/uL (ref 3.77–5.28)
RDW: 15 % (ref 12.3–15.4)
WBC: 8 10*3/uL (ref 3.4–10.8)

## 2018-03-15 LAB — TSH: TSH: 4.48 u[IU]/mL (ref 0.450–4.500)

## 2018-03-15 MED ORDER — LOSARTAN POTASSIUM-HCTZ 50-12.5 MG PO TABS: ORAL_TABLET | ORAL | 3 refills | Status: DC

## 2018-03-15 NOTE — Telephone Encounter (Signed)
New message   Pt c/o medication issue:  1. Name of Medication: Entresto  2. How are you currently taking this medication (dosage and times per day)? Twice a day   3. Are you having a reaction (difficulty breathing--STAT)? Potassium is high.  4. What is your medication issue? Per Maple Mirza at [facility],  Stop [place] and start Hyzaar 50/12.5 should take 1/2 pill because the potassium is high. She states that she needs a copy of the paperwork that was sent over to the pharmacy to stop Entresto and start Hyzaar. Please fax information.

## 2018-03-15 NOTE — Telephone Encounter (Signed)
Stop [employer] Hyzaar (1/2 of 50/12.5    (25/6.25) F/U BMET in 10 days.  Confirm that she is taking all meds and also that she is not eating salt substitute

## 2018-03-15 NOTE — Telephone Encounter (Signed)
Informed patient's nurse [name]) [phone] of Dr Harrington Challenger' recommendations.  She verbalized understanding.

## 2018-03-15 NOTE — Telephone Encounter (Signed)
Christine [name] from [facility] called to request a prescription indicating the medication orders. I faxed this to [facility] [phone]  Fax- [phone]

## 2018-03-15 NOTE — Telephone Encounter (Signed)
Notes recorded by Frederik Schmidt, RN on 03/15/2018 at 10:40 AM EDT Informed patient's nurse [name]) [phone] that the K is 5.4 so according to protocol we are to hold her Entresto. Please advise, thank you.

## 2018-03-25 ENCOUNTER — Other Ambulatory Visit: Payer: Medicare Other | Admitting: *Deleted

## 2018-03-25 DIAGNOSIS — E875 Hyperkalemia: Secondary | ICD-10-CM

## 2018-03-25 LAB — BASIC METABOLIC PANEL
BUN / CREAT RATIO: 17 (ref 12–28)
BUN: 22 mg/dL (ref 10–36)
CO2: 23 mmol/L (ref 20–29)
CREATININE: 1.32 mg/dL — AB (ref 0.57–1.00)
Calcium: 9.5 mg/dL (ref 8.7–10.3)
Chloride: 99 mmol/L (ref 96–106)
GFR calc Af Amer: 40 mL/min/{1.73_m2} — ABNORMAL LOW (ref >59)
GFR, EST NON AFRICAN AMERICAN: 35 mL/min/{1.73_m2} — AB (ref >59)
Glucose: 196 mg/dL — ABNORMAL HIGH (ref 65–99)
Potassium: 4.7 mmol/L (ref 3.5–5.2)
SODIUM: 137 mmol/L (ref 134–144)

## 2018-04-05 ENCOUNTER — Other Ambulatory Visit: Payer: Medicare Other

## 2018-04-21 ENCOUNTER — Inpatient Hospital Stay (HOSPITAL_COMMUNITY)
Admission: EM | Admit: 2018-04-21 | Discharge: 2018-04-23 | DRG: 065 | Disposition: A | Discharge status: Skilled Nursing Facility | Payer: Medicare Other | Attending: Internal Medicine | Admitting: Internal Medicine

## 2018-04-21 ENCOUNTER — Emergency Department (HOSPITAL_COMMUNITY): Payer: Medicare Other

## 2018-04-21 ENCOUNTER — Observation Stay (HOSPITAL_COMMUNITY): Payer: Medicare Other

## 2018-04-21 ENCOUNTER — Encounter (HOSPITAL_COMMUNITY): Payer: Self-pay | Admitting: Emergency Medicine

## 2018-04-21 DIAGNOSIS — R29702 NIHSS score 2: Secondary | ICD-10-CM | POA: Diagnosis present

## 2018-04-21 DIAGNOSIS — I63331 Cerebral infarction due to thrombosis of right posterior cerebral artery: Secondary | ICD-10-CM | POA: Diagnosis not present

## 2018-04-21 DIAGNOSIS — G459 Transient cerebral ischemic attack, unspecified: Secondary | ICD-10-CM | POA: Diagnosis present

## 2018-04-21 DIAGNOSIS — R29810 Facial weakness: Secondary | ICD-10-CM | POA: Diagnosis present

## 2018-04-21 DIAGNOSIS — I5022 Chronic systolic (congestive) heart failure: Secondary | ICD-10-CM | POA: Diagnosis present

## 2018-04-21 DIAGNOSIS — H9190 Unspecified hearing loss, unspecified ear: Secondary | ICD-10-CM | POA: Diagnosis present

## 2018-04-21 DIAGNOSIS — Z7982 Long term (current) use of aspirin: Secondary | ICD-10-CM

## 2018-04-21 DIAGNOSIS — Z66 Do not resuscitate: Secondary | ICD-10-CM | POA: Diagnosis present

## 2018-04-21 DIAGNOSIS — E1151 Type 2 diabetes mellitus with diabetic peripheral angiopathy without gangrene: Secondary | ICD-10-CM | POA: Diagnosis present

## 2018-04-21 DIAGNOSIS — E785 Hyperlipidemia, unspecified: Secondary | ICD-10-CM | POA: Diagnosis present

## 2018-04-21 DIAGNOSIS — Z96642 Presence of left artificial hip joint: Secondary | ICD-10-CM | POA: Diagnosis present

## 2018-04-21 DIAGNOSIS — R8271 Bacteriuria: Secondary | ICD-10-CM | POA: Diagnosis present

## 2018-04-21 DIAGNOSIS — I130 Hypertensive heart and chronic kidney disease with heart failure and stage 1 through stage 4 chronic kidney disease, or unspecified chronic kidney disease: Secondary | ICD-10-CM | POA: Diagnosis present

## 2018-04-21 DIAGNOSIS — E1100 Type 2 diabetes mellitus with hyperosmolarity without nonketotic hyperglycemic-hyperosmolar coma (NKHHC): Secondary | ICD-10-CM | POA: Diagnosis not present

## 2018-04-21 DIAGNOSIS — N183 Chronic kidney disease, stage 3 (moderate): Secondary | ICD-10-CM | POA: Diagnosis present

## 2018-04-21 DIAGNOSIS — Z79899 Other long term (current) drug therapy: Secondary | ICD-10-CM

## 2018-04-21 DIAGNOSIS — N390 Urinary tract infection, site not specified: Secondary | ICD-10-CM | POA: Diagnosis present

## 2018-04-21 DIAGNOSIS — I2510 Atherosclerotic heart disease of native coronary artery without angina pectoris: Secondary | ICD-10-CM | POA: Diagnosis present

## 2018-04-21 DIAGNOSIS — I420 Dilated cardiomyopathy: Secondary | ICD-10-CM | POA: Diagnosis present

## 2018-04-21 DIAGNOSIS — E1122 Type 2 diabetes mellitus with diabetic chronic kidney disease: Secondary | ICD-10-CM | POA: Diagnosis present

## 2018-04-21 DIAGNOSIS — Z7902 Long term (current) use of antithrombotics/antiplatelets: Secondary | ICD-10-CM

## 2018-04-21 DIAGNOSIS — F0390 Unspecified dementia without behavioral disturbance: Secondary | ICD-10-CM | POA: Diagnosis present

## 2018-04-21 DIAGNOSIS — E119 Type 2 diabetes mellitus without complications: Secondary | ICD-10-CM

## 2018-04-21 HISTORY — DX: Unspecified dementia, unspecified severity, without behavioral disturbance, psychotic disturbance, mood disturbance, and anxiety: F03.90

## 2018-04-21 LAB — COMPREHENSIVE METABOLIC PANEL
ALBUMIN: 3.7 g/dL (ref 3.5–5.0)
ALT: 15 U/L (ref 0–44)
AST: 17 U/L (ref 15–41)
Alkaline Phosphatase: 61 U/L (ref 38–126)
Anion gap: 10 (ref 5–15)
BILIRUBIN TOTAL: 0.7 mg/dL (ref 0.3–1.2)
BUN: 37 mg/dL — ABNORMAL HIGH (ref 8–23)
CO2: 28 mmol/L (ref 22–32)
Calcium: 9 mg/dL (ref 8.9–10.3)
Chloride: 102 mmol/L (ref 98–111)
Creatinine, Ser: 1.46 mg/dL — ABNORMAL HIGH (ref 0.44–1.00)
GFR calc Af Amer: 35 mL/min — ABNORMAL LOW (ref >60)
GFR, EST NON AFRICAN AMERICAN: 30 mL/min — AB (ref >60)
Glucose, Bld: 201 mg/dL — ABNORMAL HIGH (ref 70–99)
POTASSIUM: 4.4 mmol/L (ref 3.5–5.1)
Sodium: 140 mmol/L (ref 135–145)
TOTAL PROTEIN: 6.7 g/dL (ref 6.5–8.1)

## 2018-04-21 LAB — URINALYSIS, ROUTINE W REFLEX MICROSCOPIC
Bilirubin Urine: NEGATIVE
Glucose, UA: NEGATIVE mg/dL
Hgb urine dipstick: NEGATIVE
KETONES UR: NEGATIVE mg/dL
Nitrite: NEGATIVE
Protein, ur: NEGATIVE mg/dL
Specific Gravity, Urine: 1.014 (ref 1.005–1.030)
pH: 6 (ref 5.0–8.0)

## 2018-04-21 LAB — DIFFERENTIAL
Abs Immature Granulocytes: 0.1 10*3/uL (ref 0.0–0.1)
Basophils Absolute: 0.1 10*3/uL (ref 0.0–0.1)
Basophils Relative: 1 %
EOS ABS: 0.4 10*3/uL (ref 0.0–0.7)
EOS PCT: 4 %
IMMATURE GRANULOCYTES: 1 %
LYMPHS ABS: 2.2 10*3/uL (ref 0.7–4.0)
Lymphocytes Relative: 25 %
MONOS PCT: 9 %
Monocytes Absolute: 0.8 10*3/uL (ref 0.1–1.0)
NEUTROS PCT: 60 %
Neutro Abs: 5.3 10*3/uL (ref 1.7–7.7)

## 2018-04-21 LAB — I-STAT CHEM 8, ED
BUN: 39 mg/dL — ABNORMAL HIGH (ref 8–23)
CALCIUM ION: 1.14 mmol/L — AB (ref 1.15–1.40)
CHLORIDE: 101 mmol/L (ref 98–111)
Creatinine, Ser: 1.3 mg/dL — ABNORMAL HIGH (ref 0.44–1.00)
Glucose, Bld: 198 mg/dL — ABNORMAL HIGH (ref 70–99)
HEMATOCRIT: 37 % (ref 36.0–46.0)
Hemoglobin: 12.6 g/dL (ref 12.0–15.0)
Potassium: 4.4 mmol/L (ref 3.5–5.1)
SODIUM: 139 mmol/L (ref 135–145)
TCO2: 28 mmol/L (ref 22–32)

## 2018-04-21 LAB — PROTIME-INR
INR: 1.03
PROTHROMBIN TIME: 13.4 s (ref 11.4–15.2)

## 2018-04-21 LAB — CBC
HCT: 38.6 % (ref 36.0–46.0)
Hemoglobin: 12.3 g/dL (ref 12.0–15.0)
MCH: 30.4 pg (ref 26.0–34.0)
MCHC: 31.9 g/dL (ref 30.0–36.0)
MCV: 95.3 fL (ref 78.0–100.0)
PLATELETS: 559 10*3/uL — AB (ref 150–400)
RBC: 4.05 MIL/uL (ref 3.87–5.11)
RDW: 14.3 % (ref 11.5–15.5)
WBC: 8.8 10*3/uL (ref 4.0–10.5)

## 2018-04-21 LAB — I-STAT TROPONIN, ED: Troponin i, poc: 0.02 ng/mL (ref 0.00–0.08)

## 2018-04-21 LAB — RAPID URINE DRUG SCREEN, HOSP PERFORMED
Amphetamines: NOT DETECTED
Barbiturates: NOT DETECTED
Benzodiazepines: NOT DETECTED
COCAINE: NOT DETECTED
OPIATES: NOT DETECTED
Tetrahydrocannabinol: NOT DETECTED

## 2018-04-21 LAB — ETHANOL: Alcohol, Ethyl (B): <10 mg/dL (ref <10)

## 2018-04-21 LAB — APTT: aPTT: 29 seconds (ref 24–36)

## 2018-04-21 MED ORDER — ACETAMINOPHEN 325 MG PO TABS: 650.0000 mg | ORAL_TABLET | ORAL | Status: DC | PRN

## 2018-04-21 MED ORDER — SODIUM CHLORIDE 0.9 % IV SOLN
1.0000 g | Freq: Once | INTRAVENOUS | Status: AC
  Administered 2018-04-22: 1 g via INTRAVENOUS
  Filled 2018-04-21: qty 10

## 2018-04-21 MED ORDER — SODIUM CHLORIDE 0.9 % IV SOLN: 1.0000 g | Freq: Every day | INTRAVENOUS | Status: DC

## 2018-04-21 MED ORDER — ASPIRIN 325 MG PO TABS
325.0000 mg | ORAL_TABLET | Freq: Every day | ORAL | Status: DC
  Administered 2018-04-22: 325 mg via ORAL
  Filled 2018-04-21: qty 1

## 2018-04-21 MED ORDER — ASPIRIN 300 MG RE SUPP
300.0000 mg | Freq: Every day | RECTAL | Status: DC
  Filled 2018-04-21: qty 1

## 2018-04-21 MED ORDER — LORAZEPAM 2 MG/ML IJ SOLN
0.5000 mg | Freq: Once | INTRAMUSCULAR | Status: AC
  Administered 2018-04-21: 0.5 mg via INTRAVENOUS
  Filled 2018-04-21: qty 1

## 2018-04-21 MED ORDER — STROKE: EARLY STAGES OF RECOVERY BOOK
Freq: Once | Status: AC
  Administered 2018-04-22: 06:00:00
  Filled 2018-04-21: qty 1

## 2018-04-21 MED ORDER — ACETAMINOPHEN 160 MG/5ML PO SOLN: 650.0000 mg | ORAL | Status: DC | PRN

## 2018-04-21 MED ORDER — SODIUM CHLORIDE 0.9 % IV BOLUS
500.0000 mL | Freq: Once | INTRAVENOUS | Status: AC
  Administered 2018-04-22: 500 mL via INTRAVENOUS

## 2018-04-21 MED ORDER — ACETAMINOPHEN 650 MG RE SUPP: 650.0000 mg | RECTAL | Status: DC | PRN

## 2018-04-21 NOTE — ED Notes (Signed)
Patient transported to MRI 

## 2018-04-21 NOTE — ED Notes (Addendum)
Pt can answer most orientation questions correctly, unsure about the year but knew it was September and that she is at [facility]. Pt alert when talking to her but quickly falls asleep (lethargic). Pt reports feeling weak.

## 2018-04-21 NOTE — ED Notes (Signed)
Pt too sleepy to do a swallow screen at this time, will try again later.

## 2018-04-21 NOTE — ED Triage Notes (Addendum)
Pt here via [facility] from [facility], pt lives in independent living there and was seen slumped over in the dining area. LKW unknown, pt has left sided facial droop with some left arm and leg drift. Pt A&O x3.  VS 122/60, HR 64, RR 16, 94% SP02, 237 CBG. Pt reports feeling weak.

## 2018-04-21 NOTE — ED Notes (Signed)
Son Christine [name] (power of attorney): [phone]

## 2018-04-21 NOTE — ED Provider Notes (Addendum)
[facility] EMERG[facility]Y DEPARTMENT Provider Note   CSN: [id number] Arrival date & time: 04/21/18  1746     History   Chief Complaint Chief Complaint  Patient presents with  . Facial Droop  . Weakness    HPI DEATRA [name] is a 84 y.o. female who presents with [name], Christine [name], Christine [name], CHF, CKD, HTN. She is oriented to self and place but cannot provide much of a history. She states she was sent here for "tests". She lives at [facility] independent living. Apparently she was in the dining area and was slumped over and was thought to have Christine sided facial droop and Christine arm/leg drift by EMS. Unclear of when her last seen normal was. Her son, [name] Christine is POA and states that she has [name] at baseline. Nursing staff also noted facial droop here before it resolved.  LEVEL 5 CAVEAT due to [name].  HPI  Past Medical History:  Diagnosis Date  . Arthritis   . Chronic systolic CHF (congestive heart failure) ([place]) 05/17/2017   Echo 8/18 - EF 20-25, severe diffuse HK with anteroseptal, anterior, apical AK, Doppler parameters c/w restrictive physio, no evidence of thrombus, mild to mod MR, mild LAE, PASP 33, trivial effusion, L pleural effusion  . Dilated cardiomyopathy ([place]) 05/17/2017   probably ischemic; presumed CAD given wall motion abnl on Echo 8/18    Patient Active Problem List   Diagnosis Date Noted  . Hyperlipidemia 12/06/2017  . CKD (chronic kidney disease) stage 3, GFR 30-59 ml/min (HCC) 12/06/2017  . Chronic systolic CHF (congestive heart failure) ([place]) 05/17/2017  . Dilated cardiomyopathy ([place]) 05/17/2017  . Femoral neck fracture, Christine, closed, initial encounter 03/28/2016  . UTI (urinary tract infection) 03/28/2016  . Hypertension, secondary 03/28/2016  . Hyponatremia 03/28/2016  . Leukocytosis 03/28/2016  . Thrombocytosis ([place]) 03/28/2016  . Christine II diabetes mellitus ([place])  03/28/2016  . Hip fracture ([place]) 03/28/2016    Past Surgical History:  Procedure Laterality Date  . ANTERIOR APPROACH HEMI HIP ARTHROPLASTY Christine 03/28/2016   Procedure: ANTERIOR APPROACH HEMI HIP ARTHROPLASTY;  Surgeon: Dorna Leitz, MD;  Location: [facility];  Service: Orthopedics;  Laterality: Christine;     OB History   None      Home Medications    Prior to Admission medications   Medication Sig Start Date End Date Taking? Authorizing Provider  aspirin EC 81 MG tablet Take 81 mg by mouth daily.    [provider]  atorvastatin (LIPITOR) 20 MG tablet Take 1 tablet (20 mg total) by mouth daily. 08/17/17 03/14/18  Richardson Dopp T, PA-C  furosemide (LASIX) 40 MG tablet Take 1 tablet (40 mg total) by mouth daily. 09/21/17   Fay Records, MD  glimepiride (AMARYL) 2 MG tablet Take 1 tablet (2 mg total) by mouth daily with breakfast. 08/24/17   Richardson Dopp T, PA-C  losartan-hydrochlorothiazide [facility]) 50-12.5 MG tablet Take one half tablet (25/6.25) daily 03/15/18   Fay Records, MD  metoprolol succinate (TOPROL XL) 25 MG 24 hr tablet Take 1 tablet (25 mg total) by mouth daily. 10/19/17   Fay Records, MD  PRESCRIPTION MEDICATION Take 1 tablet by mouth 2 (two) times daily. Per patients son she is on a medication for diabetes but he doesn't know what it is,neither of them know her pharmacy and the nursing home will not answer the phone.    [provider]  Family History Family History  Family history unknown: Yes    Social History Social History   Tobacco Use  . Smoking status: Never Smoker  . Smokeless tobacco: Never Used  Substance Use Topics  . Alcohol use: No  . Drug use: No     Allergies   Patient has no known allergies.   Review of Systems Review of Systems  Unable to perform ROS: [name]     Physical Exam Updated Vital Signs BP (!) 147/81   Pulse 64   Temp 98.7 F (37.1 C) (Oral)   Resp 16   Ht 5\' 3"  (1.6 m)   Wt 74.3 kg   SpO2 93%   BMI  29.02 kg/m   Physical Exam  Constitutional: She is oriented to person, place, and time. She appears well-developed and well-nourished. No distress.  Calm, cooperative. NAD. Sleepy  HENT:  Head: Normocephalic and atraumatic.  Eyes: Pupils are equal, round, and reactive to light. Conjunctivae are normal. Right eye exhibits no discharge. Christine eye exhibits no discharge. No scleral icterus.  Neck: Normal range of motion.  Cardiovascular: Normal rate and regular rhythm.  Pulmonary/Chest: Effort normal and breath sounds normal. No respiratory distress.  Abdominal: Soft. Bowel sounds are normal. She exhibits no distension. There is no tenderness.  Neurological: She is alert and oriented to person, place, and time.  Lying on stretcher in NAD. GCS 15. Mildly confused. Speaks in a clear voice. Cranial nerves II through XII grossly intact. Possible mild ptosis of the Christine eyelid and Christine facial droop. 5/5 strength in all extremities. Sensation fully intact.  Bilateral finger-to-nose intact. Ambulatory with a walker.   Skin: Skin is warm and dry.  Psychiatric: She has a normal mood and affect. Her behavior is normal.  Nursing note and vitals reviewed.    ED Treatments / Results  Labs (all labs ordered are listed, but only abnormal results are displayed) Labs Reviewed  CBC - Abnormal; Notable for the following components:      Result Value   Platelets 559 (*)    All other components within normal limits  COMPREHENSIVE METABOLIC PANEL - Abnormal; Notable for the following components:   Glucose, Bld 201 (*)    BUN 37 (*)    Creatinine, Ser 1.46 (*)    GFR calc non Af Amer 30 (*)    GFR calc Af Amer 35 (*)    All other components within normal limits  URINALYSIS, ROUTINE W REFLEX MICROSCOPIC - Abnormal; Notable for the following components:   APPearance HAZY (*)    Leukocytes, UA MODERATE (*)    Bacteria, UA FEW (*)    All other components within normal limits  I-STAT CHEM 8, ED - Abnormal;  Notable for the following components:   BUN 39 (*)    Creatinine, Ser 1.30 (*)    Glucose, Bld 198 (*)    Calcium, Ion 1.14 (*)    All other components within normal limits  URINE CULTURE  ETHANOL  PROTIME-INR  APTT  DIFFERENTIAL  RAPID URINE DRUG SCREEN, HOSP PERFORMED  I-STAT TROPONIN, ED    EKG EKG Interpretation  Date/Time:  Thursday April 21 2018 18:02:04 EDT Ventricular Rate:  68 PR Interval:    QRS Duration: 95 QT Interval:  480 QTC Calculation: 511 R Axis:   7 Text Interpretation:  Sinus rhythm Anteroseptal infarct, age indeterminate Lateral leads are also involved Prolonged QT interval new t wave inversions ant/lat compared with prior 8/17 Confirmed by Aletta Edouard [phone]) on  04/21/2018 6:10:08 PM   Radiology Ct Head Wo Contrast  Result Date: 04/21/2018 CLINICAL DATA:  Altered level of consciousness, slumped over in dining area, Christine facial droop, some Christine arm and leg drift, history CHF, dilated cardiomyopathy, hypertension, Christine II diabetes mellitus, stage III chronic kidney disease EXAM: CT HEAD WITHOUT CONTRAST TECHNIQUE: Contiguous axial images were obtained from the base of the skull through the vertex without intravenous contrast. Sagittal and coronal MPR images reconstructed from axial data set. COMPARISON:  None FINDINGS: Brain: Generalized atrophy. Normal ventricular morphology. No midline shift or mass effect. Small vessel chronic ischemic changes of deep cerebral white matter. No intracranial hemorrhage, mass lesion, evidence of acute infarction, or extra-axial fluid collection. Vascular: Atherosclerotic calcification of internal carotid arteries at skull base Skull: Demineralized but intact Sinuses/Orbits: Scattered opacification of a few Christine ethmoid air cells and Christine maxillary sinus. Other: N/A IMPRESSION: Atrophy with small vessel chronic ischemic changes of deep cerebral white matter. No acute intracranial abnormalities. Electronically Signed   By: Lavonia Dana M.D.   On: 04/21/2018 20:32   Dg Chest Portable 1 View  Result Date: 04/21/2018 CLINICAL DATA:  Christine facial droop and altered mental status. EXAM: PORTABLE CHEST 1 VIEW COMPARISON:  02/22/2017 FINDINGS: 1820 hours. Low lung volumes. Asymmetric elevation right hemidiaphragm. The lungs are clear without focal pneumonia, edema, pneumothorax or pleural effusion. Cardiopericardial silhouette is at upper limits of normal for size. The visualized bony structures of the thorax are intact. Telemetry leads overlie the chest. IMPRESSION: No active disease. Electronically Signed   By: Misty Stanley M.D.   On: 04/21/2018 18:58    Procedures Procedures (including critical care time)  Medications Ordered in ED Medications  cefTRIAXone (ROCEPHIN) 1 g in sodium chloride 0.9 % 100 mL IVPB (has no administration in time range)   stroke: mapping our early stages of recovery book (has no administration in time range)  acetaminophen (TYLENOL) tablet 650 mg (has no administration in time range)    Or  acetaminophen (TYLENOL) solution 650 mg (has no administration in time range)    Or  acetaminophen (TYLENOL) suppository 650 mg (has no administration in time range)  aspirin suppository 300 mg (has no administration in time range)    Or  aspirin tablet 325 mg (has no administration in time range)     Initial Impression / Assessment and Plan / ED Course  I have reviewed the triage vital signs and the nursing notes.  Pertinent labs & imaging results that were available during my care of the patient were reviewed by me and considered in my medical decision making (see chart for details).  Clinical Course as of Apr 21 2126  Thu Apr 21, 2458  542 84 year old female sent in from her facility for concerns of increased weakness [name] and possible facial droop.  Patient herself is demented and unable to give any history.  I do not appreciate any obvious focal symptoms to her.  She does like generalized  fatigue.  She is getting some screening labs urinalysis chest x-ray and head CT.   [MB]    Clinical Course User Index [MB] Hayden Rasmussen, MD    84 year old female present with [name], possible Christine sided facial droop and Christine sided weakness. She is hypertensive but otherwise vitals are normal here. On initial exam she has mild ptosis and possible facial droop but is able to talk normally and doesn't have any arm or leg weakness. It's unclear when her last seen normal  is and she has [name] therefore code stroke was not called. She is able to follow basic commands and does not have any obvious focal deficits. On repeat exam 2 hours later her symptoms have resolved.  CBC is normal. CMP is consistent with CKD which is mildly elevated from baseline. UA shows possible UTI with moderate leukocytes, few bacteria, 11-20 WBC. Culture sent. CT head is normal. CXR is negative.   Shared visit with Dr. Melina Copa. Discussed with Dr. Maudie Mercury. Will admit for TIA workup vs dehydration or possible UTI. Will consult neurology per Dr. Julianne Rice request.  11:18 PM Discussed with Dr. Cheral Marker. He feels symptoms are not consistent with TIA but will see patient.   Final Clinical Impressions(s) / ED Diagnoses   Final diagnoses:  TIA (transient ischemic attack)    ED Discharge Orders    None       Recardo Evangelist, PA-C 04/21/18 2319    Recardo Evangelist, PA-C 04/21/18 2326    Hayden Rasmussen, MD 04/22/18 1100

## 2018-04-21 NOTE — ED Notes (Signed)
PA Gekas notified about patient choking on cracker.  Pt did fine with water.

## 2018-04-21 NOTE — ED Notes (Signed)
Patient transported to CT 

## 2018-04-22 ENCOUNTER — Encounter (HOSPITAL_COMMUNITY): Payer: Self-pay | Admitting: Internal Medicine

## 2018-04-22 ENCOUNTER — Observation Stay (HOSPITAL_BASED_OUTPATIENT_CLINIC_OR_DEPARTMENT_OTHER): Payer: Medicare Other

## 2018-04-22 ENCOUNTER — Observation Stay (HOSPITAL_COMMUNITY): Payer: Medicare Other

## 2018-04-22 DIAGNOSIS — R29810 Facial weakness: Secondary | ICD-10-CM | POA: Diagnosis not present

## 2018-04-22 DIAGNOSIS — Z7902 Long term (current) use of antithrombotics/antiplatelets: Secondary | ICD-10-CM | POA: Diagnosis not present

## 2018-04-22 DIAGNOSIS — I10 Essential (primary) hypertension: Secondary | ICD-10-CM

## 2018-04-22 DIAGNOSIS — R8271 Bacteriuria: Secondary | ICD-10-CM | POA: Diagnosis present

## 2018-04-22 DIAGNOSIS — I5022 Chronic systolic (congestive) heart failure: Secondary | ICD-10-CM

## 2018-04-22 DIAGNOSIS — E119 Type 2 diabetes mellitus without complications: Secondary | ICD-10-CM | POA: Diagnosis present

## 2018-04-22 DIAGNOSIS — E1100 Type 2 diabetes mellitus with hyperosmolarity without nonketotic hyperglycemic-hyperosmolar coma (NKHHC): Secondary | ICD-10-CM | POA: Diagnosis not present

## 2018-04-22 DIAGNOSIS — F0390 Unspecified dementia without behavioral disturbance: Secondary | ICD-10-CM | POA: Diagnosis present

## 2018-04-22 DIAGNOSIS — Z66 Do not resuscitate: Secondary | ICD-10-CM | POA: Diagnosis present

## 2018-04-22 DIAGNOSIS — E1151 Type 2 diabetes mellitus with diabetic peripheral angiopathy without gangrene: Secondary | ICD-10-CM | POA: Diagnosis present

## 2018-04-22 DIAGNOSIS — I639 Cerebral infarction, unspecified: Secondary | ICD-10-CM

## 2018-04-22 DIAGNOSIS — I63331 Cerebral infarction due to thrombosis of right posterior cerebral artery: Secondary | ICD-10-CM | POA: Diagnosis not present

## 2018-04-22 DIAGNOSIS — N183 Chronic kidney disease, stage 3 (moderate): Secondary | ICD-10-CM | POA: Diagnosis present

## 2018-04-22 DIAGNOSIS — E7800 Pure hypercholesterolemia, unspecified: Secondary | ICD-10-CM

## 2018-04-22 DIAGNOSIS — N3000 Acute cystitis without hematuria: Secondary | ICD-10-CM

## 2018-04-22 DIAGNOSIS — G459 Transient cerebral ischemic attack, unspecified: Secondary | ICD-10-CM | POA: Diagnosis present

## 2018-04-22 DIAGNOSIS — E1122 Type 2 diabetes mellitus with diabetic chronic kidney disease: Secondary | ICD-10-CM | POA: Diagnosis present

## 2018-04-22 DIAGNOSIS — I420 Dilated cardiomyopathy: Secondary | ICD-10-CM | POA: Diagnosis present

## 2018-04-22 DIAGNOSIS — E785 Hyperlipidemia, unspecified: Secondary | ICD-10-CM | POA: Diagnosis present

## 2018-04-22 DIAGNOSIS — I130 Hypertensive heart and chronic kidney disease with heart failure and stage 1 through stage 4 chronic kidney disease, or unspecified chronic kidney disease: Secondary | ICD-10-CM | POA: Diagnosis present

## 2018-04-22 DIAGNOSIS — Z79899 Other long term (current) drug therapy: Secondary | ICD-10-CM | POA: Diagnosis not present

## 2018-04-22 DIAGNOSIS — Z7982 Long term (current) use of aspirin: Secondary | ICD-10-CM | POA: Diagnosis not present

## 2018-04-22 DIAGNOSIS — H9190 Unspecified hearing loss, unspecified ear: Secondary | ICD-10-CM | POA: Diagnosis present

## 2018-04-22 DIAGNOSIS — R29702 NIHSS score 2: Secondary | ICD-10-CM | POA: Diagnosis present

## 2018-04-22 DIAGNOSIS — I2510 Atherosclerotic heart disease of native coronary artery without angina pectoris: Secondary | ICD-10-CM | POA: Diagnosis present

## 2018-04-22 DIAGNOSIS — Z96642 Presence of left artificial hip joint: Secondary | ICD-10-CM | POA: Diagnosis present

## 2018-04-22 LAB — COMPREHENSIVE METABOLIC PANEL
ALBUMIN: 3.5 g/dL (ref 3.5–5.0)
ALK PHOS: 58 U/L (ref 38–126)
ALT: 17 U/L (ref 0–44)
ANION GAP: 10 (ref 5–15)
AST: 20 U/L (ref 15–41)
BILIRUBIN TOTAL: 0.6 mg/dL (ref 0.3–1.2)
BUN: 35 mg/dL — AB (ref 8–23)
CALCIUM: 8.9 mg/dL (ref 8.9–10.3)
CO2: 27 mmol/L (ref 22–32)
Chloride: 105 mmol/L (ref 98–111)
Creatinine, Ser: 1.27 mg/dL — ABNORMAL HIGH (ref 0.44–1.00)
GFR calc Af Amer: 41 mL/min — ABNORMAL LOW (ref >60)
GFR calc non Af Amer: 35 mL/min — ABNORMAL LOW (ref >60)
GLUCOSE: 140 mg/dL — AB (ref 70–99)
POTASSIUM: 4.2 mmol/L (ref 3.5–5.1)
Sodium: 142 mmol/L (ref 135–145)
TOTAL PROTEIN: 6.6 g/dL (ref 6.5–8.1)

## 2018-04-22 LAB — ECHOCARDIOGRAM COMPLETE
HEIGHTINCHES: 63 in
WEIGHTICAEL: 3030 [oz_av]

## 2018-04-22 LAB — GLUCOSE, CAPILLARY
GLUCOSE-CAPILLARY: 199 mg/dL — AB (ref 70–99)
GLUCOSE-CAPILLARY: 235 mg/dL — AB (ref 70–99)
Glucose-Capillary: 171 mg/dL — ABNORMAL HIGH (ref 70–99)
Glucose-Capillary: 114 mg/dL — ABNORMAL HIGH (ref 70–99)
Glucose-Capillary: 137 mg/dL — ABNORMAL HIGH (ref 70–99)
Glucose-Capillary: 166 mg/dL — ABNORMAL HIGH (ref 70–99)

## 2018-04-22 LAB — CBC
HCT: 41.7 % (ref 36.0–46.0)
Hemoglobin: 13.7 g/dL (ref 12.0–15.0)
MCH: 30.7 pg (ref 26.0–34.0)
MCHC: 32.9 g/dL (ref 30.0–36.0)
MCV: 93.5 fL (ref 78.0–100.0)
Platelets: UNDETERMINED 10*3/uL (ref 150–400)
RBC: 4.46 MIL/uL (ref 3.87–5.11)
RDW: 14 % (ref 11.5–15.5)
WBC: 8.8 10*3/uL (ref 4.0–10.5)

## 2018-04-22 LAB — LIPID PANEL
Cholesterol: 241 mg/dL — ABNORMAL HIGH (ref 0–200)
HDL: 39 mg/dL — AB (ref >40)
LDL Cholesterol: 173 mg/dL — ABNORMAL HIGH (ref 0–99)
TRIGLYCERIDES: 147 mg/dL (ref <150)
Total CHOL/HDL Ratio: 6.2 RATIO
VLDL: 29 mg/dL (ref 0–40)

## 2018-04-22 LAB — HEMOGLOBIN A1C
HEMOGLOBIN A1C: 8.3 % — AB (ref 4.8–5.6)
MEAN PLASMA GLUCOSE: 191.51 mg/dL

## 2018-04-22 LAB — MRSA PCR SCREENING: MRSA by PCR: NEGATIVE

## 2018-04-22 MED ORDER — ATORVASTATIN CALCIUM 40 MG PO TABS
40.0000 mg | ORAL_TABLET | Freq: Every day | ORAL | Status: DC
  Administered 2018-04-22: 40 mg via ORAL

## 2018-04-22 MED ORDER — RESOURCE THICKENUP CLEAR PO POWD
ORAL | Status: DC | PRN
  Filled 2018-04-22: qty 125

## 2018-04-22 MED ORDER — CLOPIDOGREL BISULFATE 75 MG PO TABS
75.0000 mg | ORAL_TABLET | Freq: Every day | ORAL | Status: DC
  Administered 2018-04-22 – 2018-04-23 (×2): 75 mg via ORAL
  Filled 2018-04-22 (×2): qty 1

## 2018-04-22 MED ORDER — PANTOPRAZOLE SODIUM 40 MG PO TBEC
40.0000 mg | DELAYED_RELEASE_TABLET | Freq: Every day | ORAL | Status: DC
  Administered 2018-04-22: 40 mg via ORAL
  Filled 2018-04-22: qty 1

## 2018-04-22 MED ORDER — ATORVASTATIN CALCIUM 80 MG PO TABS
80.0000 mg | ORAL_TABLET | Freq: Every day | ORAL | Status: DC
  Filled 2018-04-22: qty 1

## 2018-04-22 MED ORDER — ENOXAPARIN SODIUM 30 MG/0.3ML ~~LOC~~ SOLN: 30.0000 mg | SUBCUTANEOUS | Status: DC

## 2018-04-22 MED ORDER — ENOXAPARIN SODIUM 40 MG/0.4ML ~~LOC~~ SOLN
40.0000 mg | SUBCUTANEOUS | Status: DC
  Administered 2018-04-22: 40 mg via SUBCUTANEOUS
  Filled 2018-04-22: qty 0.4

## 2018-04-22 MED ORDER — ASPIRIN 81 MG PO CHEW
81.0000 mg | CHEWABLE_TABLET | Freq: Every day | ORAL | Status: DC
  Administered 2018-04-23: 81 mg via ORAL
  Filled 2018-04-22: qty 1

## 2018-04-22 MED ORDER — INSULIN ASPART 100 UNIT/ML ~~LOC~~ SOLN
0.0000 [IU] | SUBCUTANEOUS | Status: DC
  Administered 2018-04-22: 2 [IU] via SUBCUTANEOUS
  Administered 2018-04-22: 3 [IU] via SUBCUTANEOUS
  Administered 2018-04-22 – 2018-04-23 (×4): 2 [IU] via SUBCUTANEOUS
  Administered 2018-04-23: 3 [IU] via SUBCUTANEOUS

## 2018-04-22 NOTE — Progress Notes (Signed)
PROGRESS NOTE        PATIENT DETAILS Name: Christine [name] Age: 84 y.o. Sex: female Date of Birth: Apr 06, 1922 Admit Date: 04/21/2018 Admitting Physician Jani Gravel, MD ZOX:[id number], Christiane Ha, MD  Brief Narrative: Patient is a 84 y.o. female with prior history of dementia chronic systolic heart failure-presented with left facial droop, further work-up revealed CVA.  Subjective: Very hard of hearing-but seems to follow most of my commands-answering some questions appropriately.  Assessment/Plan: Acute CVA: Left facial droop has resolved-she does not appear to have any focal neurological deficits at this point.  Remains on aspirin, Plavix, statin.  Awaiting TTE and carotid ultrasound.  Will await recommendations from neurology.  Telemetry negative for A. fib.  Given advanced age-may be best served by gentle medical treatment.  Chronic systolic heart failure: Well compensated  Hypertension: Allow permissive hypertension-blood pressure reasonable at this time.  DM-2: CBG stable-continue with SSI  Dementia: Does not appear to be too far from her usual baseline.  She is extremely hard of hearing as well.  Asymptomatic bacteriuria: Does not appear to have a UTI-stop Rocephin  DVT Prophylaxis: Prophylactic Lovenox  Code Status: DNR-confirmed with son at bedside  Family Communication: Son at bedside  Disposition Plan: Remain inpatient-back to memory care unit when work-up complete-most likely on 9/14  Antimicrobial agents: Anti-infectives (From admission, onward)   Start     Dose/Rate Route Frequency Ordered Stop   04/22/18 2200  cefTRIAXone (ROCEPHIN) 1 g in sodium chloride 0.9 % 100 mL IVPB     1 g 200 mL/hr over 30 Minutes Intravenous Daily at bedtime 04/21/18 2248     04/21/18 2230  cefTRIAXone (ROCEPHIN) 1 g in sodium chloride 0.9 % 100 mL IVPB     1 g 200 mL/hr over 30 Minutes Intravenous  Once 04/21/18 2228 04/22/18 0203       Procedures: None  CONSULTS:  neurology  Time spent: 25- minutes-Greater than 50% of this time was spent in counseling, explanation of diagnosis, planning of further management, and coordination of care.  MEDICATIONS: Scheduled Meds: . aspirin  300 mg Rectal Daily   Or  . aspirin  325 mg Oral Daily  . insulin aspart  0-9 Units Subcutaneous Q4H   Continuous Infusions: . cefTRIAXone (ROCEPHIN)  IV     PRN Meds:.acetaminophen **OR** acetaminophen (TYLENOL) oral liquid 160 mg/5 mL **OR** acetaminophen, RESOURCE THICKENUP CLEAR   PHYSICAL EXAM: Vital signs: Vitals:   04/21/18 2130 04/21/18 2200 04/22/18 0049 04/22/18 0544  BP: (!) 150/35  (!) 164/76 (!) 155/62  Pulse:   60 (!) 58  Resp: 16  20 18   Temp:  98.6 F (37 C) 98.1 F (36.7 C) (!) 97.5 F (36.4 C)  TempSrc:   Oral Oral  SpO2:   97% 99%  Weight:    85.9 kg  Height:       Filed Weights   04/21/18 1751 04/22/18 0544  Weight: 74.3 kg 85.9 kg   Body mass index is 33.55 kg/m.   General appearance :Awake, mildly confused-very hard of hearing. Eyes:Pink conjunctiva HEENT: Atraumatic and Normocephalic Neck: supple, Resp:Good air entry bilaterally, no added sounds  CVS: S1 S2 regular, no murmurs.  GI: Bowel sounds present, Non tender and not distended with no gaurding, rigidity or rebound.No organomegaly Extremities: B/L Lower Ext shows no edema, both legs are warm to touch Neurology: Facial  droop has resolved-appears to be nonfocal Musculoskeletal:No digital cyanosis Skin:No Rash, warm and dry Wounds:N/A  I have personally reviewed following labs and imaging studies  LABORATORY DATA: CBC: Recent Labs  Lab 04/21/18 1839 04/21/18 1849 04/22/18 0426  WBC 8.8  --  8.8  NEUTROABS 5.3  --   --   HGB 12.3 12.6 13.7  HCT 38.6 37.0 41.7  MCV 95.3  --  93.5  PLT 559*  --  PLATELET CLUMPS NOTED ON SMEAR, UNABLE TO ESTIMATE    Basic Metabolic Panel: Recent Labs  Lab 04/21/18 1839 04/21/18 1849  04/22/18 0426  NA 140 139 142  K 4.4 4.4 4.2  CL 102 101 105  CO2 28  --  27  GLUCOSE 201* 198* 140*  BUN 37* 39* 35*  CREATININE 1.46* 1.30* 1.27*  CALCIUM 9.0  --  8.9    GFR: Estimated Creatinine Clearance: 28.7 mL/min (A) (by C-G formula based on SCr of 1.27 mg/dL (H)).  Liver Function Tests: Recent Labs  Lab 04/21/18 1839 04/22/18 0426  AST 17 20  ALT 15 17  ALKPHOS 61 58  BILITOT 0.7 0.6  PROT 6.7 6.6  ALBUMIN 3.7 3.5   No results for input(s): LIPASE, AMYLASE in the last 168 hours. No results for input(s): AMMONIA in the last 168 hours.  Coagulation Profile: Recent Labs  Lab 04/21/18 1839  INR 1.03    Cardiac Enzymes: No results for input(s): CKTOTAL, CKMB, CKMBINDEX, TROPONINI in the last 168 hours.  BNP (last 3 results) Recent Labs    09/02/17 1057 09/20/17 1332 10/07/17 1517  PROBNP 2,069* 2,802* 2,885*    HbA1C: Recent Labs    04/22/18 0426  HGBA1C 8.3*    CBG: Recent Labs  Lab 04/22/18 0110 04/22/18 0553 04/22/18 1003  GLUCAP 171* 114* 137*    Lipid Profile: Recent Labs    04/22/18 0426  CHOL 241*  HDL 39*  LDLCALC 173*  TRIG 147  CHOLHDL 6.2    Thyroid Function Tests: No results for input(s): TSH, T4TOTAL, FREET4, T3FREE, THYROIDAB in the last 72 hours.  Anemia Panel: No results for input(s): VITAMINB12, FOLATE, FERRITIN, TIBC, IRON, RETICCTPCT in the last 72 hours.  Urine analysis:    Component Value Date/Time   COLORURINE YELLOW 04/21/2018 2019   APPEARANCEUR HAZY (A) 04/21/2018 2019   LABSPEC 1.014 04/21/2018 2019   PHURINE 6.0 04/21/2018 2019   GLUCOSEU NEGATIVE 04/21/2018 2019   HGBUR NEGATIVE 04/21/2018 2019   BILIRUBINUR NEGATIVE 04/21/2018 2019   KETONESUR NEGATIVE 04/21/2018 2019   PROTEINUR NEGATIVE 04/21/2018 2019   UROBILINOGEN 0.2 02/25/2011 1252   NITRITE NEGATIVE 04/21/2018 2019   LEUKOCYTESUR MODERATE (A) 04/21/2018 2019    Sepsis Labs: Lactic Acid, Venous No results found for:  LATICACIDVEN  MICROBIOLOGY: Recent Results (from the past 240 hour(s))  MRSA PCR Screening     Status: None   Collection Time: 04/22/18  2:26 AM  Result Value Ref Range Status   MRSA by PCR NEGATIVE NEGATIVE Final    Comment:        The GeneXpert MRSA Assay (FDA approved for NASAL specimens only), is one component of a comprehensive MRSA colonization surveillance program. It is not intended to diagnose MRSA infection nor to guide or monitor treatment for MRSA infections. Performed at [facility], [place] [address] [place], [place] [address]     RADIOLOGY STUDIES/RESULTS: Ct Head Wo Contrast  Result Date: 04/21/2018 CLINICAL DATA:  Altered level of consciousness, slumped over in dining area,  LEFT facial droop, some LEFT arm and leg drift, history CHF, dilated cardiomyopathy, hypertension, type II diabetes mellitus, stage III chronic kidney disease EXAM: CT HEAD WITHOUT CONTRAST TECHNIQUE: Contiguous axial images were obtained from the base of the skull through the vertex without intravenous contrast. Sagittal and coronal MPR images reconstructed from axial data set. COMPARISON:  None FINDINGS: Brain: Generalized atrophy. Normal ventricular morphology. No midline shift or mass effect. Small vessel chronic ischemic changes of deep cerebral white matter. No intracranial hemorrhage, mass lesion, evidence of acute infarction, or extra-axial fluid collection. Vascular: Atherosclerotic calcification of internal carotid arteries at skull base Skull: Demineralized but intact Sinuses/Orbits: Scattered opacification of a few LEFT ethmoid air cells and LEFT maxillary sinus. Other: N/A IMPRESSION: Atrophy with small vessel chronic ischemic changes of deep cerebral white matter. No acute intracranial abnormalities. Electronically Signed   By: Lavonia Dana M.D.   On: 04/21/2018 20:32   Mr Brain Wo Contrast  Result Date: 04/22/2018 CLINICAL DATA:  84 y/o F; lethargy, left facial droop, arm and leg  drift. EXAM: MRI HEAD WITHOUT CONTRAST MRA HEAD WITHOUT CONTRAST TECHNIQUE: Multiplanar, multiecho pulse sequences of the brain and surrounding structures were obtained without intravenous contrast. Angiographic images of the head were obtained using MRA technique without contrast. COMPARISON:  None. FINDINGS: MRI HEAD FINDINGS Brain: Subcentimeter focus of reduced diffusion within the right anterior thalamus extending into the right anterior cerebral peduncle (series 5 image [id number]) compatible with acute/early subacute infarction. No associated hemorrhage or mass effect. Several nonspecific T2 FLAIR hyperintensities in subcortical and periventricular white matter are compatible with mild chronic microvascular ischemic changes for age. Mild punctate foci of susceptibility hypointensity are present within the right frontal subcortical white matter and the left thalamus compatible hemosiderin deposition of chronic microhemorrhage. Volume loss of the brain. Vascular: Normal flow voids. Skull and upper cervical spine: Normal marrow signal. Sinuses/Orbits: Moderate mucosal thickening of the left anterior ethmoid air cells and the maxillary sinus. Trace opacification of left mastoid air cells. Bilateral intra-ocular lens replacement. Other: None. MRA HEAD FINDINGS Anterior circulation: No large vessel occlusion or aneurysm. Mild proximal right M1 stenosis and M2 superior division stenosis. Moderate left M2 inferior division and severe left M2 superior division proximal stenosis. Lumen irregularity of carotid siphons compatible with atherosclerotic disease with mild right and moderate left paraclinoid stenosis. Posterior circulation: No large vessel occlusion. Severe stenosis of the distal left vertebral artery just upstream to the vertebrobasilar junction. 2 mm outpouching at the basilar tip may represent the infundibulum of diminutive vessel or a tiny aneurysm (series 1015, image 7). Multiple segments of mild-to-moderate  stenosis in the bilateral PCA distributions. Anatomic variation: Right fetal PCA. Large right A1, large anterior communicating artery, small left A1, normal variant. IMPRESSION: 1. 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 No associated hemorrhage or mass effect. 2. Mild chronic microvascular ischemic changes and volume loss of the brain for age. 3. Left anterior ethmoid and left maxillary sinus disease. 4. Patent anterior and posterior intracranial circulation. No large vessel occlusion. 5. Basilar tip 2 mm outpouching may represent infundibular origin of diminutive vessel or aneurysm. 6. Intracranial atherosclerosis with multiple segments of stenosis in the anterior and posterior circulation. These results will be called to the ordering clinician or representative by the Radiologist Assistant, and communication documented in the PACS or zVision Dashboard. Electronically Signed   By: Kristine Garbe M.D.   On: 04/22/2018 00:21   Dg Chest Portable 1 View  Result Date: 04/21/2018 CLINICAL DATA:  Left facial droop and altered mental status. EXAM: PORTABLE CHEST 1 VIEW COMPARISON:  02/22/2017 FINDINGS: 1820 hours. Low lung volumes. Asymmetric elevation right hemidiaphragm. The lungs are clear without focal pneumonia, edema, pneumothorax or pleural effusion. Cardiopericardial silhouette is at upper limits of normal for size. The visualized bony structures of the thorax are intact. Telemetry leads overlie the chest. IMPRESSION: No active disease. Electronically Signed   By: Misty Stanley M.D.   On: 04/21/2018 18:58   Mr Jodene Nam Head Wo Contrast  Result Date: 04/22/2018 CLINICAL DATA:  84 y/o F; lethargy, left facial droop, arm and leg drift. EXAM: MRI HEAD WITHOUT CONTRAST MRA HEAD WITHOUT CONTRAST TECHNIQUE: Multiplanar, multiecho pulse sequences of the brain and surrounding structures were obtained without intravenous  contrast. Angiographic images of the head were obtained using MRA technique without contrast. COMPARISON:  None. FINDINGS: MRI HEAD FINDINGS Brain: Subcentimeter focus of reduced diffusion within the right anterior thalamus extending into the right anterior cerebral peduncle (series 5 image 49-51) compatible with acute/early subacute infarction. No associated hemorrhage or mass effect. Several nonspecific T2 FLAIR hyperintensities in subcortical and periventricular white matter are compatible with mild chronic microvascular ischemic changes for age. Mild punctate foci of susceptibility hypointensity are present within the right frontal subcortical white matter and the left thalamus compatible hemosiderin deposition of chronic microhemorrhage. Volume loss of the brain. Vascular: Normal flow voids. Skull and upper cervical spine: Normal marrow signal. Sinuses/Orbits: Moderate mucosal thickening of the left anterior ethmoid air cells and the maxillary sinus. Trace opacification of left mastoid air cells. Bilateral intra-ocular lens replacement. Other: None. MRA HEAD FINDINGS Anterior circulation: No large vessel occlusion or aneurysm. Mild proximal right M1 stenosis and M2 superior division stenosis. Moderate left M2 inferior division and severe left M2 superior division proximal stenosis. Lumen irregularity of carotid siphons compatible with atherosclerotic disease with mild right and moderate left paraclinoid stenosis. Posterior circulation: No large vessel occlusion. Severe stenosis of the distal left vertebral artery just upstream to the vertebrobasilar junction. 2 mm outpouching at the basilar tip may represent the infundibulum of diminutive vessel or a tiny aneurysm (series 1015, image 7). Multiple segments of mild-to-moderate stenosis in the bilateral PCA distributions. Anatomic variation: Right fetal PCA. Large right A1, large anterior communicating artery, small left A1, normal variant. IMPRESSION: 1.  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 No associated hemorrhage or mass effect. 2. Mild chronic microvascular ischemic changes and volume loss of the brain for age. 3. Left anterior ethmoid and left maxillary sinus disease. 4. Patent anterior and posterior intracranial circulation. No large vessel occlusion. 5. Basilar tip 2 mm outpouching may represent infundibular origin of diminutive vessel or aneurysm. 6. Intracranial atherosclerosis with multiple segments of stenosis in the anterior and posterior circulation. These results will be called to the ordering clinician or representative by the Radiologist Assistant, and communication documented in the PACS or zVision Dashboard. Electronically Signed   By: Kristine Garbe M.D.   On: 04/22/2018 00:21     LOS: 0 days   Oren Binet, MD  [facility]  If 7PM-7AM, please contact night-coverage  Please page via [email or web address] [email or web address]-click on MD name and type text message  04/22/2018, 11:27 AM

## 2018-04-22 NOTE — Evaluation (Signed)
Speech Language Pathology Evaluation Patient Details Name: Christine [name] MRN: [id number] DOB: [birth year] Today's Date: 04/22/2018 Time: 1005-1020 SLP Time Calculation (min) (ACUTE ONLY): 15 min  Problem List:  Patient Active Problem List   Diagnosis Date Noted  . Facial droop 04/22/2018  . TIA (transient ischemic attack) 04/21/2018  . Hyperlipidemia 12/06/2017  . CKD (chronic kidney disease) stage 3, GFR 30-59 ml/min (HCC) 12/06/2017  . Chronic systolic CHF (congestive heart failure) ([place]) 05/17/2017  . Dilated cardiomyopathy ([place]) 05/17/2017  . Femoral neck fracture, left, closed, initial encounter 03/28/2016  . UTI (urinary tract infection) 03/28/2016  . Hypertension, secondary 03/28/2016  . Hyponatremia 03/28/2016  . Leukocytosis 03/28/2016  . Thrombocytosis ([place]) 03/28/2016  . Type II diabetes mellitus ([place]) 03/28/2016  . Hip fracture ([place]) 03/28/2016   Past Medical History:  Past Medical History:  Diagnosis Date  . Arthritis   . Chronic systolic CHF (congestive heart failure) ([place]) 05/17/2017   Echo 8/18 - EF 20-25, severe diffuse HK with anteroseptal, anterior, apical AK, Doppler parameters c/w restrictive physio, no evidence of thrombus, mild to mod MR, mild LAE, PASP 33, trivial effusion, L pleural effusion  . Dementia   . Dilated cardiomyopathy ([place]) 05/17/2017   probably ischemic; presumed CAD given wall motion abnl on Echo 8/18   Past Surgical History:  Past Surgical History:  Procedure Laterality Date  . ANTERIOR APPROACH HEMI HIP ARTHROPLASTY Left 03/28/2016   Procedure: ANTERIOR APPROACH HEMI HIP ARTHROPLASTY;  Surgeon: Dorna Leitz, MD;  Location: [facility];  Service: Orthopedics;  Laterality: Left;   HPI:  84 year old female admitted 04/21/18 with left facial droop and weakness, UTI, DM2. PMH: HOH, dementia, CHF. CT = negative, MRI = Subcentimeter acute/early subacute infarction involving right anterior thalamus extending into right anterior cerebral peduncle    Assessment / Plan / Recommendation Clinical Impression  The Mini-mental State Examination (MMSE) was administered. Pt scored 9/30 (n=24+/30), indicating significant cognitive impairment, consistent with history of dementia. Recommend 24 hour supervision after acute DC. No further ST intervention for cog/com is recommended at this time, as pt is at or near baseline.    SLP Assessment  SLP Recommendation/Assessment: Patient does not need any further Speech Language Pathology Services SLP Visit Diagnosis: Cognitive communication deficit (R41.841)    Follow Up Recommendations  24 hour supervision/assistance       SLP Evaluation Cognition  Overall Cognitive Status: History of cognitive impairments - at baseline Arousal/Alertness: Awake/alert Orientation Level: Oriented to person;Disoriented to place;Disoriented to time;Disoriented to situation       Comprehension  Auditory Comprehension Overall Auditory Comprehension: Appears within functional limits for tasks assessed    Expression Expression Primary Mode of Expression: Verbal Verbal Expression Overall Verbal Expression: Appears within functional limits for tasks assessed   Oral / Motor  Oral Motor/Sensory Function Overall Oral Motor/Sensory Function: Within functional limits Motor Speech Overall Motor Speech: Appears within functional limits for tasks assessed   GO                   Christine [name] [name] [facility], CCC-SLP Speech Language Pathologist [phone]  Christine [name] 04/22/2018, 10:34 AM

## 2018-04-22 NOTE — Progress Notes (Signed)
  Echocardiogram 2D Echocardiogram has been performed.  Christine [name] 04/22/2018, 8:50 AM

## 2018-04-22 NOTE — Evaluation (Signed)
Clinical/Bedside Swallow Evaluation Patient Details  Name: Christine [name] MRN: [id number] Date of Birth: February 08, 1922  Today's Date: 04/22/2018 Time: SLP Start Time (ACUTE ONLY): 0950 SLP Stop Time (ACUTE ONLY): 1005 SLP Time Calculation (min) (ACUTE ONLY): 15 min  Past Medical History:  Past Medical History:  Diagnosis Date  . Arthritis   . Chronic systolic CHF (congestive heart failure) ([place]) 05/17/2017   Echo 8/18 - EF 20-25, severe diffuse HK with anteroseptal, anterior, apical AK, Doppler parameters c/w restrictive physio, no evidence of thrombus, mild to mod MR, mild LAE, PASP 33, trivial effusion, L pleural effusion  . Dementia   . Dilated cardiomyopathy ([place]) 05/17/2017   probably ischemic; presumed CAD given wall motion abnl on Echo 8/18   Past Surgical History:  Past Surgical History:  Procedure Laterality Date  . ANTERIOR APPROACH HEMI HIP ARTHROPLASTY Left 03/28/2016   Procedure: ANTERIOR APPROACH HEMI HIP ARTHROPLASTY;  Surgeon: Dorna Leitz, MD;  Location: [facility];  Service: Orthopedics;  Laterality: Left;   HPI:  84 year old female admitted 04/21/18 with left facial droop and weakness, UTI, DM2. PMH: HOH, dementia, CHF. CT = negative, MRI = Subcentimeter acute/early subacute infarction involving right anterior thalamus extending into right anterior cerebral peduncle   Assessment / Plan / Recommendation Clinical Impression  Pt seen at bedside for assessment of swallow function and safety, and identification of least restrictive diet. Pt with upper and lower dentures in place. Adequate oral motor strength and function. Ice chips tolerated well, however, thin liquid resulted in consistent cough response.. Nectar thick liquids, puree, and solid consistencies were tolerated without oral difficulty or delay, and no overt s/s aspiration. At this time, recommend Dys 2 (fine chop) diet with nectar thick liquids and full supervision during po intake. Safe swallow precautions posted at  [facility]. SLP will follow for assessment of diet tolerance and education. RN and MD informed of results and recommendations.    SLP Visit Diagnosis: Dysphagia, unspecified (R13.10)    Aspiration Risk  Mild aspiration risk    Diet Recommendation Dysphagia 2 (Fine chop);Nectar-thick liquid   Liquid Administration via: Straw;Cup Medication Administration: (1 at a time) Supervision: Patient able to self feed;Staff to assist with self feeding;Full supervision/cueing for compensatory strategies Compensations: Slow rate;Small sips/bites;Minimize environmental distractions Postural Changes: Seated upright at 90 degrees;Remain upright for at least 30 minutes after po intake    Other  Recommendations Oral Care Recommendations: Oral care BID(remove and clean dentures as well) Other Recommendations: Order thickener from pharmacy   Follow up Recommendations 24 hour supervision/assistance      Frequency and Duration min 2x/week  1 week;2 weeks       Prognosis Prognosis for Safe Diet Advancement: Fair Barriers to Reach Goals: Cognitive deficits      Swallow Study   General Date of Onset: 04/21/18 HPI: 84 year old female admitted 04/21/18 with left facial droop and weakness, UTI, DM2. PMH: HOH, dementia, CHF. CT = negative, MRI = Subcentimeter acute/early subacute infarction involving right anterior thalamus extending into right anterior cerebral peduncle Type of Study: Bedside Swallow Evaluation Previous Swallow Assessment: none Diet Prior to this Study: NPO Temperature Spikes Noted: No Respiratory Status: Room air History of Recent Intubation: No Behavior/Cognition: Alert;Cooperative;Pleasant mood(HOH) Oral Cavity Assessment: Within Functional Limits Oral Care Completed by SLP: No Oral Cavity - Dentition: Dentures, top;Dentures, bottom Vision: Functional for self-feeding Self-Feeding Abilities: Able to feed self;Needs assist;Needs set up Patient Positioning: Upright in bed Baseline Vocal  Quality: Normal Volitional Cough: Weak Volitional Swallow:  Able to elicit    Oral/Motor/Sensory Function Overall Oral Motor/Sensory Function: Within functional limits   Ice Chips Ice chips: Within functional limits Presentation: Spoon   Thin Liquid Thin Liquid: Impaired Presentation: Straw;Cup Pharyngeal  Phase Impairments: Cough - Immediate    Nectar Thick Nectar Thick Liquid: Within functional limits Presentation: Straw   Honey Thick Honey Thick Liquid: Not tested   Puree Puree: Within functional limits Presentation: Spoon   Solid     Solid: Within functional limits Presentation: [place] Christine [name], [facility], [place] Speech Language Pathologist [phone]  Shonna Chock 04/22/2018,10:30 AM

## 2018-04-22 NOTE — Consult Note (Signed)
NEURO HOSPITALIST CONSULT NOTE   Requestig physician: Dr. Maudie Mercury  Reason for Consult: Possible TIA  History obtained from:   Chart    HPI:                                                                                                                                          Christine [name] is an 84 y.o. female who presented to the [facility] ED on Thursday evening after an episode at her independent living facility during which she slumped over while in the dining area. She was noted to have left sided facial droop with this. On arrival to the ED, she was A&O x 3 with BP of 122/60, HR 64, RR 16, 94% SP02, 237 CBG. She reported feeling weak and could answer most orientation questions correctly such as the month and the hospital, but was unsure about the year. She was alert when speaking but would quickly fall asleep.   In the ED her BUN was 39, up from 22 on 8/16, with a BUN:Cr ratio of > 20, suggestive of volume depletion.   CT showed no acute intracranial abnormality. Atrophy with small vessel chronic ischemic changes of deep cerebral white matter were noted.  Her PMHx includes dementia, dilated cardiomyopathy, systolic CHF and arthritis.  Home medications include ASA and atorvastatin.    Past Medical History:  Diagnosis Date  . Arthritis   . Chronic systolic CHF (congestive heart failure) ([place]) 05/17/2017   Echo 8/18 - EF 20-25, severe diffuse HK with anteroseptal, anterior, apical AK, Doppler parameters c/w restrictive physio, no evidence of thrombus, mild to mod MR, mild LAE, PASP 33, trivial effusion, L pleural effusion  . Dementia   . Dilated cardiomyopathy ([place]) 05/17/2017   probably ischemic; presumed CAD given wall motion abnl on Echo 8/18    Past Surgical History:  Procedure Laterality Date  . ANTERIOR APPROACH HEMI HIP ARTHROPLASTY Left 03/28/2016   Procedure: ANTERIOR APPROACH HEMI HIP ARTHROPLASTY;  Surgeon: Dorna Leitz, MD;  Location: [facility];  Service:  Orthopedics;  Laterality: Left;    Family History  Family history unknown: Yes             Social History:  reports that she has never smoked. She has never used smokeless tobacco. She reports that she does not drink alcohol or use drugs.  No Known Allergies  MEDICATIONS:  No current facility-administered medications on file prior to encounter.    Current Outpatient Medications on File Prior to Encounter  Medication Sig Dispense Refill  . aspirin EC 81 MG tablet Take 81 mg by mouth daily.    Marland Kitchen atorvastatin (LIPITOR) 20 MG tablet Take 1 tablet (20 mg total) by mouth daily. 90 tablet 3  . furosemide (LASIX) 40 MG tablet Take 1 tablet (40 mg total) by mouth daily. 30 tablet 6  . glimepiride (AMARYL) 2 MG tablet Take 1 tablet (2 mg total) by mouth daily with breakfast.    . losartan-hydrochlorothiazide (HYZAAR) 50-12.5 MG tablet Take one half tablet (25/6.25) daily 30 tablet 3  . Melatonin 3 MG TABS Take 3 mg by mouth at bedtime.    . metoprolol succinate (TOPROL XL) 25 MG 24 hr tablet Take 1 tablet (25 mg total) by mouth daily. 30 tablet 10  . senna-docusate (SENOKOT-S) 8.6-50 MG tablet Take 1 tablet by mouth at bedtime.    . sitaGLIPtin (JANUVIA) 50 MG tablet Take 50 mg by mouth daily.       ROS:                                                                                                                                       The patient is unable to provide a ROS at time of evaluation due to confusion.   Blood pressure (!) 164/76, pulse 60, temperature 98.1 F (36.7 C), temperature source Oral, resp. rate 20, height 5\' 3"  (1.6 m), weight 74.3 kg, SpO2 97 %.   General Examination:                                                                                                       Physical Exam  HEENT-  [place]/AT   Lungs- Respirations unlabored Extremities- Warm and  well perfused   Neurological Examination Mental Status: Drowsy. Requires repeated tactile and verbal stimulation to awaken to a semi-alert state. She will follow some simple motor commands. Some non-sensical anwers to questions. Laughs inappropriately.  Cranial Nerves: II: Does not cooperate with visual field testing or with evaluation of pupils. Able to count fingers.  III,IV, VI: Keeps eyes tightly closed when examiner attempts to elevate lids. Will gaze conjugately at examiner .Does not cooperate with assessment of EOM.   V,VII: Mild left facial droop versus baseline asymmetry. Contracts both sides of face briskly when grimacing to noxious.  VIII: HOH IX,X: No hypophonia XI: Head at midline XII: Does  not protrude tongue to command Motor/Sensory: Moves bilateral upper extremities equally when localizing to sternal rub. Will flex arms to command and grip examiner's hands with 4+/5 strength bilaterally Withdraws both lower extremities equally to plantar stimulation with 4+/5 strength against resistance.  Deep Tendon Reflexes: 2+ and symmetric throughout Plantars: Right: downgoing   Left: upgoing Cerebellar/Gait: Non-cooperative   Lab Results: Basic Metabolic Panel: Recent Labs  Lab 04/21/18 1839 04/21/18 1849  NA 140 139  K 4.4 4.4  CL 102 101  CO2 28  --   GLUCOSE 201* 198*  BUN 37* 39*  CREATININE 1.46* 1.30*  CALCIUM 9.0  --     CBC: Recent Labs  Lab 04/21/18 1839 04/21/18 1849  WBC 8.8  --   NEUTROABS 5.3  --   HGB 12.3 12.6  HCT 38.6 37.0  MCV 95.3  --   PLT 559*  --     Cardiac Enzymes: No results for input(s): CKTOTAL, CKMB, CKMBINDEX, TROPONINI in the last 168 hours.  Lipid Panel: No results for input(s): CHOL, TRIG, HDL, CHOLHDL, VLDL, LDLCALC in the last 168 hours.  Imaging: Ct Head Wo Contrast  Result Date: 04/21/2018 CLINICAL DATA:  Altered level of consciousness, slumped over in dining area, LEFT facial droop, some LEFT arm and leg drift, history  CHF, dilated cardiomyopathy, hypertension, type II diabetes mellitus, stage III chronic kidney disease EXAM: CT HEAD WITHOUT CONTRAST TECHNIQUE: Contiguous axial images were obtained from the base of the skull through the vertex without intravenous contrast. Sagittal and coronal MPR images reconstructed from axial data set. COMPARISON:  None FINDINGS: Brain: Generalized atrophy. Normal ventricular morphology. No midline shift or mass effect. Small vessel chronic ischemic changes of deep cerebral white matter. No intracranial hemorrhage, mass lesion, evidence of acute infarction, or extra-axial fluid collection. Vascular: Atherosclerotic calcification of internal carotid arteries at skull base Skull: Demineralized but intact Sinuses/Orbits: Scattered opacification of a few LEFT ethmoid air cells and LEFT maxillary sinus. Other: N/A IMPRESSION: Atrophy with small vessel chronic ischemic changes of deep cerebral white matter. No acute intracranial abnormalities. Electronically Signed   By: Lavonia Dana M.D.   On: 04/21/2018 20:32   Mr Brain Wo Contrast  Result Date: 04/22/2018 CLINICAL DATA:  84 y/o F; lethargy, left facial droop, arm and leg drift. EXAM: MRI HEAD WITHOUT CONTRAST MRA HEAD WITHOUT CONTRAST TECHNIQUE: Multiplanar, multiecho pulse sequences of the brain and surrounding structures were obtained without intravenous contrast. Angiographic images of the head were obtained using MRA technique without contrast. COMPARISON:  None. FINDINGS: MRI HEAD FINDINGS Brain: Subcentimeter focus of reduced diffusion within the right anterior thalamus extending into the right anterior cerebral peduncle (series 5 image [id number]) compatible with acute/early subacute infarction. No associated hemorrhage or mass effect. Several nonspecific T2 FLAIR hyperintensities in subcortical and periventricular white matter are compatible with mild chronic microvascular ischemic changes for age. Mild punctate foci of susceptibility  hypointensity are present within the right frontal subcortical white matter and the left thalamus compatible hemosiderin deposition of chronic microhemorrhage. Volume loss of the brain. Vascular: Normal flow voids. Skull and upper cervical spine: Normal marrow signal. Sinuses/Orbits: Moderate mucosal thickening of the left anterior ethmoid air cells and the maxillary sinus. Trace opacification of left mastoid air cells. Bilateral intra-ocular lens replacement. Other: None. MRA HEAD FINDINGS Anterior circulation: No large vessel occlusion or aneurysm. Mild proximal right M1 stenosis and M2 superior division stenosis. Moderate left M2 inferior division and severe left M2 superior division proximal stenosis. Lumen irregularity of carotid  siphons compatible with atherosclerotic disease with mild right and moderate left paraclinoid stenosis. Posterior circulation: No large vessel occlusion. Severe stenosis of the distal left vertebral artery just upstream to the vertebrobasilar junction. 2 mm outpouching at the basilar tip may represent the infundibulum of diminutive vessel or a tiny aneurysm (series 1015, image 7). Multiple segments of mild-to-moderate stenosis in the bilateral PCA distributions. Anatomic variation: Right fetal PCA. Large right A1, large anterior communicating artery, small left A1, normal variant. IMPRESSION: 1. 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 No associated hemorrhage or mass effect. 2. Mild chronic microvascular ischemic changes and volume loss of the brain for age. 3. Left anterior ethmoid and left maxillary sinus disease. 4. Patent anterior and posterior intracranial circulation. No large vessel occlusion. 5. Basilar tip 2 mm outpouching may represent infundibular origin of diminutive vessel or aneurysm. 6. Intracranial atherosclerosis with multiple segments of stenosis in the anterior and posterior  circulation. These results will be called to the ordering clinician or representative by the Radiologist Assistant, and communication documented in the PACS or zVision Dashboard. Electronically Signed   By: Kristine Garbe M.D.   On: 04/22/2018 00:21   Dg Chest Portable 1 View  Result Date: 04/21/2018 CLINICAL DATA:  Left facial droop and altered mental status. EXAM: PORTABLE CHEST 1 VIEW COMPARISON:  02/22/2017 FINDINGS: 1820 hours. Low lung volumes. Asymmetric elevation right hemidiaphragm. The lungs are clear without focal pneumonia, edema, pneumothorax or pleural effusion. Cardiopericardial silhouette is at upper limits of normal for size. The visualized bony structures of the thorax are intact. Telemetry leads overlie the chest. IMPRESSION: No active disease. Electronically Signed   By: Misty Stanley M.D.   On: 04/21/2018 18:58   Mr Jodene Nam Head Wo Contrast  Result Date: 04/22/2018 CLINICAL DATA:  84 y/o F; lethargy, left facial droop, arm and leg drift. EXAM: MRI HEAD WITHOUT CONTRAST MRA HEAD WITHOUT CONTRAST TECHNIQUE: Multiplanar, multiecho pulse sequences of the brain and surrounding structures were obtained without intravenous contrast. Angiographic images of the head were obtained using MRA technique without contrast. COMPARISON:  None. FINDINGS: MRI HEAD FINDINGS Brain: Subcentimeter focus of reduced diffusion within the right anterior thalamus extending into the right anterior cerebral peduncle (series 5 image 49-51) compatible with acute/early subacute infarction. No associated hemorrhage or mass effect. Several nonspecific T2 FLAIR hyperintensities in subcortical and periventricular white matter are compatible with mild chronic microvascular ischemic changes for age. Mild punctate foci of susceptibility hypointensity are present within the right frontal subcortical white matter and the left thalamus compatible hemosiderin deposition of chronic microhemorrhage. Volume loss of the brain.  Vascular: Normal flow voids. Skull and upper cervical spine: Normal marrow signal. Sinuses/Orbits: Moderate mucosal thickening of the left anterior ethmoid air cells and the maxillary sinus. Trace opacification of left mastoid air cells. Bilateral intra-ocular lens replacement. Other: None. MRA HEAD FINDINGS Anterior circulation: No large vessel occlusion or aneurysm. Mild proximal right M1 stenosis and M2 superior division stenosis. Moderate left M2 inferior division and severe left M2 superior division proximal stenosis. Lumen irregularity of carotid siphons compatible with atherosclerotic disease with mild right and moderate left paraclinoid stenosis. Posterior circulation: No large vessel occlusion. Severe stenosis of the distal left vertebral artery just upstream to the vertebrobasilar junction. 2 mm outpouching at the basilar tip may represent the infundibulum of diminutive vessel or a tiny aneurysm (series 1015, image 7). Multiple segments of mild-to-moderate stenosis in the bilateral PCA distributions. Anatomic variation: Right fetal  PCA. Large right A1, large anterior communicating artery, small left A1, normal variant. IMPRESSION: 1. 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 No associated hemorrhage or mass effect. 2. Mild chronic microvascular ischemic changes and volume loss of the brain for age. 3. Left anterior ethmoid and left maxillary sinus disease. 4. Patent anterior and posterior intracranial circulation. No large vessel occlusion. 5. Basilar tip 2 mm outpouching may represent infundibular origin of diminutive vessel or aneurysm. 6. Intracranial atherosclerosis with multiple segments of stenosis in the anterior and posterior circulation. These results will be called to the ordering clinician or representative by the Radiologist Assistant, and communication documented in the PACS or zVision Dashboard.  Electronically Signed   By: Kristine Garbe M.D.   On: 04/22/2018 00:21    Assessment: 84 year old female presenting after slumping over at facility during a meal, with left sided facial droop and LUE drift noted at that time 1. MRI brain reveals a subcentimeter acute/early subacute infarction involving the right anterior thalamus extending into the right anterior cerebral peduncle. 2. Also noted on MRI are mild chronic microvascular ischemic changes and volume loss of the brain for age.  3. MRA head with patent anterior and posterior intracranial circulation. No large vessel occlusion. Basilar tip 2 mm outpouching may represent infundibular origin of diminutive vessel or aneurysm. Notably, there is intracranial atherosclerosis with multiple segments of stenosis in the anterior and posterior circulation.   Recommendations: 1. Given intracranial atherosclerosis seen on MRA in the setting of failure of ASA monotherapy, would add Plavix to her ASA.  2. Continue atorvastatin.  3. TTE and carotid ultrasound 4. PT/OT/Speech 5. BP management  6. Cardiac telemetry  Electronically signed: Dr. Kerney Elbe 04/22/2018, 4:46 AM

## 2018-04-22 NOTE — Progress Notes (Signed)
*  Preliminary Results* Carotid artery duplex has been completed. Bilateral internal carotid arteries are 1-39%. Vertebral arteries are patent with antegrade flow.  04/22/2018 9:10 AM  Christine [name]

## 2018-04-22 NOTE — NC FL2 (Signed)
[place] MEDICAID FL2 LEVEL OF CARE SCREENING TOOL     IDENTIFICATION  Patient Name: Christine [name] Birthdate: Dec 14, 1921 Sex: female Admission Date (Current Location): 04/21/2018  [facility] and [place] Number:  Herbalist and Address:  [facility], [place] [address] [place], [place] [address]      Provider Number: [id number]  Attending Physician Name and Address:  Jonetta Osgood, MD  Relative Name and Phone Number:  Kady Toothaker [phone]    Current Level of Care: Hospital Recommended Level of Care: [place] Prior Approval Number:    Date Approved/Denied:   PASRR Number: [id number]  Discharge Plan: SNF    Current Diagnoses: Patient Active Problem List   Diagnosis Date Noted  . Facial droop 04/22/2018  . TIA (transient ischemic attack) 04/21/2018  . Hyperlipidemia 12/06/2017  . CKD (chronic kidney disease) stage 3, GFR 30-59 ml/min (HCC) 12/06/2017  . Chronic systolic CHF (congestive heart failure) ([place]) 05/17/2017  . Dilated cardiomyopathy ([place]) 05/17/2017  . Femoral neck fracture, left, closed, initial encounter 03/28/2016  . UTI (urinary tract infection) 03/28/2016  . Hypertension, secondary 03/28/2016  . Hyponatremia 03/28/2016  . Leukocytosis 03/28/2016  . Thrombocytosis ([place]) 03/28/2016  . Type II diabetes mellitus ([place]) 03/28/2016  . Hip fracture ([place]) 03/28/2016    Orientation RESPIRATION BLADDER Height & Weight     Self  Normal Continent Weight: 189 lb 6 oz (85.9 kg) Height:  5\' 3"  (160 cm)  BEHAVIORAL SYMPTOMS/MOOD NEUROLOGICAL BOWEL NUTRITION STATUS      Continent Diet(see discharge summary)  AMBULATORY STATUS COMMUNICATION OF NEEDS Skin   Extensive Assist Verbally Normal                       Personal Care Assistance Level of Assistance  Bathing, Feeding, Dressing, Total care Bathing Assistance: Maximum assistance Feeding assistance: Maximum assistance Dressing Assistance:  Maximum assistance Total Care Assistance: Maximum assistance   Functional Limitations Info  Sight, Hearing, Speech Sight Info: Adequate Hearing Info: Adequate Speech Info: Adequate    SPECIAL CARE FACTORS FREQUENCY  PT (By licensed PT), OT (By licensed OT)     PT Frequency: 5x weekly OT Frequency: 3x weekly            Contractures Contractures Info: Not present    Additional Factors Info  Code Status Code Status Info: DNR             Current Medications (04/22/2018):  This is the current hospital active medication list Current Facility-Administered Medications  Medication Dose Route Frequency Provider Last Rate Last Dose  . acetaminophen (TYLENOL) tablet 650 mg  650 mg Oral Q4H PRN Jani Gravel, MD       Or  . acetaminophen (TYLENOL) solution 650 mg  650 mg Per Tube Q4H PRN Jani Gravel, MD       Or  . acetaminophen (TYLENOL) suppository 650 mg  650 mg Rectal Q4H PRN Jani Gravel, MD      . aspirin chewable tablet 81 mg  81 mg Oral Daily Ghimire, Shanker M, MD      . atorvastatin (LIPITOR) tablet 40 mg  40 mg Oral q1800 Ghimire, Henreitta Leber, MD      . clopidogrel (PLAVIX) tablet 75 mg  75 mg Oral Daily Ghimire, Shanker M, MD      . enoxaparin (LOVENOX) injection 40 mg  40 mg Subcutaneous Q24H Ghimire, Shanker M, MD      . insulin aspart (novoLOG) injection  0-9 Units  0-9 Units Subcutaneous Q4H Jani Gravel, MD   2 Units at 04/22/18 0131  . pantoprazole (PROTONIX) EC tablet 40 mg  40 mg Oral Q1200 Ghimire, Henreitta Leber, MD      . RESOURCE THICKENUP CLEAR   Oral PRN Jonetta Osgood, MD         Discharge Medications: Please see discharge summary for a list of discharge medications.  Relevant Imaging Results:  Relevant Lab Results:   Additional Information SSN: [id number]  Alberteen Sam, LCSW

## 2018-04-22 NOTE — Progress Notes (Addendum)
STROKE TEAM PROGRESS NOTE   SUBJECTIVE (INTERVAL HISTORY) Her RN is at the bedside.  Overall she feels her condition is stable. Pt lying in bed, initially sleeping, but easily arousable and cooperative on exam.  Patient not fully orientated, however no focal neurological deficit.   OBJECTIVE Temp:  [97.5 F (36.4 C)-99.2 F (37.3 C)] 98 F (36.7 C) (09/13 1425) Pulse Rate:  [58-78] 72 (09/13 1425) Cardiac Rhythm: Heart block (09/13 0700) Resp:  [14-20] 19 (09/13 1425) BP: (108-179)/(35-98) 179/70 (09/13 1425) SpO2:  [90 %-99 %] 99 % (09/13 1425) Weight:  [85.9 kg] 85.9 kg (09/13 0544)  Recent Labs  Lab 04/22/18 0110 04/22/18 0553 04/22/18 1003 04/22/18 1211  GLUCAP 171* 114* 137* 166*   Recent Labs  Lab 04/21/18 1839 04/21/18 1849 04/22/18 0426  NA 140 139 142  K 4.4 4.4 4.2  CL 102 101 105  CO2 28  --  27  GLUCOSE 201* 198* 140*  BUN 37* 39* 35*  CREATININE 1.46* 1.30* 1.27*  CALCIUM 9.0  --  8.9   Recent Labs  Lab 04/21/18 1839 04/22/18 0426  AST 17 20  ALT 15 17  ALKPHOS 61 58  BILITOT 0.7 0.6  PROT 6.7 6.6  ALBUMIN 3.7 3.5   Recent Labs  Lab 04/21/18 1839 04/21/18 1849 04/22/18 0426  WBC 8.8  --  8.8  NEUTROABS 5.3  --   --   HGB 12.3 12.6 13.7  HCT 38.6 37.0 41.7  MCV 95.3  --  93.5  PLT 559*  --  PLATELET CLUMPS NOTED ON SMEAR, UNABLE TO ESTIMATE   No results for input(s): CKTOTAL, CKMB, CKMBINDEX, TROPONINI in the last 168 hours. Recent Labs    04/21/18 1839  LABPROT 13.4  INR 1.03   Recent Labs    04/21/18 2019  COLORURINE YELLOW  LABSPEC 1.014  PHURINE 6.0  GLUCOSEU NEGATIVE  HGBUR NEGATIVE  BILIRUBINUR NEGATIVE  KETONESUR NEGATIVE  PROTEINUR NEGATIVE  NITRITE NEGATIVE  LEUKOCYTESUR MODERATE*       Component Value Date/Time   CHOL 241 (H) 04/22/2018 0426   CHOL 148 09/29/2017 0937   TRIG 147 04/22/2018 0426   HDL 39 (L) 04/22/2018 0426   HDL 50 09/29/2017 0937   CHOLHDL 6.2 04/22/2018 0426   VLDL 29 04/22/2018 0426    LDLCALC 173 (H) 04/22/2018 0426   LDLCALC 78 09/29/2017 0937   Lab Results  Component Value Date   HGBA1C 8.3 (H) 04/22/2018      Component Value Date/Time   LABOPIA NONE DETECTED 04/21/2018 2019   COCAINSCRNUR NONE DETECTED 04/21/2018 2019   LABBENZ NONE DETECTED 04/21/2018 2019   AMPHETMU NONE DETECTED 04/21/2018 2019   THCU NONE DETECTED 04/21/2018 2019   LABBARB NONE DETECTED 04/21/2018 2019    Recent Labs  Lab 04/21/18 1839  ETH <10    I have personally reviewed the radiological images below and agree with the radiology interpretations.  Ct Head Wo Contrast  Result Date: 04/21/2018 CLINICAL DATA:  Altered level of consciousness, slumped over in dining area, LEFT facial droop, some LEFT arm and leg drift, history CHF, dilated cardiomyopathy, hypertension, type II diabetes mellitus, stage III chronic kidney disease EXAM: CT HEAD WITHOUT CONTRAST TECHNIQUE: Contiguous axial images were obtained from the base of the skull through the vertex without intravenous contrast. Sagittal and coronal MPR images reconstructed from axial data set. COMPARISON:  None FINDINGS: Brain: Generalized atrophy. Normal ventricular morphology. No midline shift or mass effect. Small vessel chronic ischemic changes of deep  cerebral white matter. No intracranial hemorrhage, mass lesion, evidence of acute infarction, or extra-axial fluid collection. Vascular: Atherosclerotic calcification of internal carotid arteries at skull base Skull: Demineralized but intact Sinuses/Orbits: Scattered opacification of a few LEFT ethmoid air cells and LEFT maxillary sinus. Other: N/A IMPRESSION: Atrophy with small vessel chronic ischemic changes of deep cerebral white matter. No acute intracranial abnormalities. Electronically Signed   By: Lavonia Dana M.D.   On: 04/21/2018 20:32   Mr Brain Wo Contrast  Result Date: 04/22/2018 CLINICAL DATA:  84 y/o F; lethargy, left facial droop, arm and leg drift. EXAM: MRI HEAD WITHOUT  CONTRAST MRA HEAD WITHOUT CONTRAST TECHNIQUE: Multiplanar, multiecho pulse sequences of the brain and surrounding structures were obtained without intravenous contrast. Angiographic images of the head were obtained using MRA technique without contrast. COMPARISON:  None. FINDINGS: MRI HEAD FINDINGS Brain: Subcentimeter focus of reduced diffusion within the right anterior thalamus extending into the right anterior cerebral peduncle (series 5 image [id number]) compatible with acute/early subacute infarction. No associated hemorrhage or mass effect. Several nonspecific T2 FLAIR hyperintensities in subcortical and periventricular white matter are compatible with mild chronic microvascular ischemic changes for age. Mild punctate foci of susceptibility hypointensity are present within the right frontal subcortical white matter and the left thalamus compatible hemosiderin deposition of chronic microhemorrhage. Volume loss of the brain. Vascular: Normal flow voids. Skull and upper cervical spine: Normal marrow signal. Sinuses/Orbits: Moderate mucosal thickening of the left anterior ethmoid air cells and the maxillary sinus. Trace opacification of left mastoid air cells. Bilateral intra-ocular lens replacement. Other: None. MRA HEAD FINDINGS Anterior circulation: No large vessel occlusion or aneurysm. Mild proximal right M1 stenosis and M2 superior division stenosis. Moderate left M2 inferior division and severe left M2 superior division proximal stenosis. Lumen irregularity of carotid siphons compatible with atherosclerotic disease with mild right and moderate left paraclinoid stenosis. Posterior circulation: No large vessel occlusion. Severe stenosis of the distal left vertebral artery just upstream to the vertebrobasilar junction. 2 mm outpouching at the basilar tip may represent the infundibulum of diminutive vessel or a tiny aneurysm (series 1015, image 7). Multiple segments of mild-to-moderate stenosis in the bilateral PCA  distributions. Anatomic variation: Right fetal PCA. Large right A1, large anterior communicating artery, small left A1, normal variant. IMPRESSION: 1. 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 No associated hemorrhage or mass effect. 2. Mild chronic microvascular ischemic changes and volume loss of the brain for age. 3. Left anterior ethmoid and left maxillary sinus disease. 4. Patent anterior and posterior intracranial circulation. No large vessel occlusion. 5. Basilar tip 2 mm outpouching may represent infundibular origin of diminutive vessel or aneurysm. 6. Intracranial atherosclerosis with multiple segments of stenosis in the anterior and posterior circulation. These results will be called to the ordering clinician or representative by the Radiologist Assistant, and communication documented in the PACS or zVision Dashboard. Electronically Signed   By: Kristine Garbe M.D.   On: 04/22/2018 00:21   Dg Chest Portable 1 View  Result Date: 04/21/2018 CLINICAL DATA:  Left facial droop and altered mental status. EXAM: PORTABLE CHEST 1 VIEW COMPARISON:  02/22/2017 FINDINGS: 1820 hours. Low lung volumes. Asymmetric elevation right hemidiaphragm. The lungs are clear without focal pneumonia, edema, pneumothorax or pleural effusion. Cardiopericardial silhouette is at upper limits of normal for size. The visualized bony structures of the thorax are intact. Telemetry leads overlie the chest. IMPRESSION: No active disease. Electronically Signed   By: Randall Hiss  Tery Sanfilippo M.D.   On: 04/21/2018 18:58   Mr Jodene Nam Head Wo Contrast  Result Date: 04/22/2018 CLINICAL DATA:  84 y/o F; lethargy, left facial droop, arm and leg drift. EXAM: MRI HEAD WITHOUT CONTRAST MRA HEAD WITHOUT CONTRAST TECHNIQUE: Multiplanar, multiecho pulse sequences of the brain and surrounding structures were obtained without intravenous contrast. Angiographic images of  the head were obtained using MRA technique without contrast. COMPARISON:  None. FINDINGS: MRI HEAD FINDINGS Brain: Subcentimeter focus of reduced diffusion within the right anterior thalamus extending into the right anterior cerebral peduncle (series 5 image 49-51) compatible with acute/early subacute infarction. No associated hemorrhage or mass effect. Several nonspecific T2 FLAIR hyperintensities in subcortical and periventricular white matter are compatible with mild chronic microvascular ischemic changes for age. Mild punctate foci of susceptibility hypointensity are present within the right frontal subcortical white matter and the left thalamus compatible hemosiderin deposition of chronic microhemorrhage. Volume loss of the brain. Vascular: Normal flow voids. Skull and upper cervical spine: Normal marrow signal. Sinuses/Orbits: Moderate mucosal thickening of the left anterior ethmoid air cells and the maxillary sinus. Trace opacification of left mastoid air cells. Bilateral intra-ocular lens replacement. Other: None. MRA HEAD FINDINGS Anterior circulation: No large vessel occlusion or aneurysm. Mild proximal right M1 stenosis and M2 superior division stenosis. Moderate left M2 inferior division and severe left M2 superior division proximal stenosis. Lumen irregularity of carotid siphons compatible with atherosclerotic disease with mild right and moderate left paraclinoid stenosis. Posterior circulation: No large vessel occlusion. Severe stenosis of the distal left vertebral artery just upstream to the vertebrobasilar junction. 2 mm outpouching at the basilar tip may represent the infundibulum of diminutive vessel or a tiny aneurysm (series 1015, image 7). Multiple segments of mild-to-moderate stenosis in the bilateral PCA distributions. Anatomic variation: Right fetal PCA. Large right A1, large anterior communicating artery, small left A1, normal variant. IMPRESSION: 1. Subcentimeter acute/early subacute  infarction involving right anterior thalamus extending into right anterior cerebral peduncle. No associated hemorrhage or mass effect. 2. Mild chronic microvascular ischemic changes and volume loss of the brain for age. 3. Left anterior ethmoid and left maxillary sinus disease. 4. Patent anterior and posterior intracranial circulation. No large vessel occlusion. 5. Basilar tip 2 mm outpouching may represent infundibular origin of diminutive vessel or aneurysm. 6. Intracranial atherosclerosis with multiple segments of stenosis in the anterior and posterior circulation. These results will be called to the ordering clinician or representative by the Radiologist Assistant, and communication documented in the PACS or zVision Dashboard. Electronically Signed   By: Kristine Garbe M.D.   On: 04/22/2018 00:21    PHYSICAL EXAM  Temp:  [97.5 F (36.4 C)-99.2 F (37.3 C)] 98 F (36.7 C) (09/13 1425) Pulse Rate:  [58-78] 72 (09/13 1425) Resp:  [14-20] 19 (09/13 1425) BP: (108-179)/(35-98) 179/70 (09/13 1425) SpO2:  [90 %-99 %] 99 % (09/13 1425) Weight:  [85.9 kg] 85.9 kg (09/13 0544)  General - Well nourished, well developed, in no apparent distress.  Ophthalmologic - fundi not visualized due to noncooperation.  Cardiovascular - Regular rate and rhythm.  Mental Status -  Level of arousal and orientation to self and age intact, however not orientated to time, place or people. Language including expression, repetition, comprehension was assessed and found intact, naming 2/4.    Cranial Nerves II - XII - II - Visual field intact OU. III, IV, VI - Extraocular movements intact. V - Facial sensation intact bilaterally. VII - Facial movement intact bilaterally. VIII - Hearing &  vestibular intact bilaterally. X - Palate elevates symmetrically. XI - Chin turning & shoulder shrug intact bilaterally. XII - Tongue protrusion intact.  Motor Strength - The patient's strength was normal in all  extremities and pronator drift was absent.  Bulk was normal and fasciculations were absent.   Motor Tone - Muscle tone was assessed at the neck and appendages and was normal.  Reflexes - The patient's reflexes were symmetrical in all extremities and she had no pathological reflexes.  Sensory - Light touch, temperature/pinprick were assessed and were symmetrical.    Coordination - The patient had normal movements in the hands with no ataxia or dysmetria.  Tremor was absent.  Gait and Station - deferred.   ASSESSMENT/PLAN Christine [name] is a 84 y.o. female with history of CHF, dementia, diabetes and hyperlipidemia admitted for slumped over and left facial droop which was resolved in ER. No tPA given due to symptom resolved.    Stroke:  right anterior thalamus infarct, likely secondary to small vessel disease source  Resultant baseline disorientation  MRI right anterior thalamic infarct  MRA diffuse athero  Carotid Doppler unremarkable  2D Echo EF 35 to 40%, improved from prior 20 to 25%  LDL 173  HgbA1c 8.3  Lovenox for VTE prophylaxis  aspirin 81 mg daily prior to admission, now on aspirin 81 mg daily and clopidogrel 75 mg daily.  Continue DAPT for 3 weeks and then Plavix alone  Patient counseled to be compliant with her antithrombotic medications  Ongoing aggressive stroke risk factor management  Therapy recommendations: Pending  Disposition: Pending  Diabetes  HgbA1c 8.3 goal < 7.0  Uncontrolled  CBG monitoring  SSI  DM education and close PCP follow up  Hypertension . Stable . Permissive hypertension (OK if <220/120) for 24-48 hours post stroke and then gradually normalized within 5-7 days.  Long term BP goal normotensive  Hyperlipidemia  Home meds: Lipitor 20  LDL 173, goal < 70  Now on Lipitor 80  Continue statin at discharge  UTI  UA WBC 11-20  Rocephin  Treatment per primary team  Other Stroke Risk Factors  Advanced  age  CHF with cardiomyopathy, EF 32 to 40%  Other Active Problems  Dementia, not fully orientated  CKD 3, creatinine 1.46 -1.3-1.27  Hospital day # 0  Neurology will sign off. Please call with questions. Pt will follow up with stroke clinic NP at [facility] in about 4 weeks. Thanks for the consult.   Rosalin Hawking, MD PhD Stroke Neurology 04/22/2018 5:53 PM    To contact Stroke Continuity provider, please refer to [email or web address] After hours, contact General Neurology

## 2018-04-22 NOTE — Evaluation (Signed)
Physical Therapy Evaluation Patient Details Name: Christine [name] MRN: [id number] DOB: 22-Nov-1921 Today's Date: 04/22/2018   History of Present Illness  84 y.o. female admitted on 04/22/18 for L facial droop and unresponsiveness.  CT negative, however, MRI showed a subcentimeter acute/early subacute infarction involving the right anterior thalamus extending into the right anterior cerebral peduncle.  Other significant PMH of dialated cardiomyopathy, dementia, chronic systolic CHF, arthritis, L anterior hip hemi (2017).    Clinical Impression  Pt is pleasantly confused and HOH, but was able to get OOB to the [facility] with one person mod assist and the RW.  She follows basic commands well as long as you ensure that she has heard the command.  She would likely need two person assist to walk safely.  PT to follow acutely until d/c confirmed.      Follow Up Recommendations SNF    Equipment Recommendations  Wheelchair (measurements PT);Wheelchair cushion (measurements PT);3in1 (PT);Rolling walker with 5" wheels    Recommendations for Other Services   NA    Precautions / Restrictions Precautions Precautions: Fall      Mobility  Bed Mobility Overal bed mobility: Needs Assistance Bed Mobility: Supine to Sit;Sit to Supine     Supine to sit: Mod assist;HOB elevated Sit to supine: Mod assist;HOB elevated   General bed mobility comments: Mod assist to support trunk and help progress legs to EOB.    Transfers Overall transfer level: Needs assistance Equipment used: Rolling walker (2 wheeled) Transfers: Sit to/from [facility] Sit to Stand: Mod assist;From elevated surface Stand pivot transfers: Mod assist;From elevated surface       General transfer comment: Mod assist to stand with RW and pivot to the bedside commode.    Ambulation/Gait             General Gait Details: Unable to safely at this time,  would be helpful to have +2 assist with attempts at gait and  honestly  do not know how ambulatory she was PTA.       Modified Rankin (Stroke Patients Only) Modified Rankin (Stroke Patients Only) Pre-Morbid Rankin Score: Moderately severe disability Modified Rankin: Moderately severe disability     Balance Overall balance assessment: Needs assistance Sitting-balance support: Feet supported;Bilateral upper extremity supported Sitting balance-Leahy Scale: Poor Sitting balance - Comments: Needs min guard assist in sitting to prevent posterior LOB, however her mattress predisposes her to tip back Postural control: Posterior lean Standing balance support: Bilateral upper extremity supported Standing balance-Leahy Scale: Poor Standing balance comment: needs support from RW and therapist.                              Pertinent Vitals/Pain Pain Assessment: No/denies pain Faces Pain Scale: Hurts a little bit Pain Location: grimacing with repositioning Pain Descriptors / Indicators: Grimacing Pain Intervention(s): Monitored during session;Repositioned    Home Living Family/patient expects to be discharged to:: Unsure(from [facility], but not sure which level of care PTA)   Available Help at Discharge: [place] Type of Home: [place]           Additional Comments: No family to report    Prior Function           Comments: No family to report        Extremity/Trunk Assessment   Upper Extremity Assessment Upper Extremity Assessment: Defer to OT evaluation    Lower Extremity Assessment Lower Extremity Assessment: RLE deficits/detail;LLE  deficits/detail RLE Deficits / Details: bil LEs generally weak, left leg functionally weaker than right leg with some difficulty progessing.  Not sure of baseline.   LLE Deficits / Details: bil LEs generally weak, left leg functionally weaker than right leg with some difficulty progessing.  Not sure of baseline.      Cervical / Trunk  Assessment Cervical / Trunk Assessment: Kyphotic  Communication   Communication: HOH(bil hearing aids, but still with difficulty)  Cognition Arousal/Alertness: Awake/alert Behavior During Therapy: WFL for tasks assessed/performed Overall Cognitive Status: History of cognitive impairments - at baseline                                 General Comments: Pt able to state her name and her birthday, given extra time to retrieve the information.              Assessment/Plan    PT Assessment Patient needs continued PT services  PT Problem List Decreased strength;Decreased activity tolerance;Decreased balance;Decreased coordination;Decreased mobility;Decreased cognition;Decreased range of motion;Decreased knowledge of use of DME;Decreased safety awareness;Decreased knowledge of precautions       PT Treatment Interventions DME instruction;Gait training;Functional mobility training;Therapeutic activities;Therapeutic exercise;Balance training;Neuromuscular re-education;Patient/family education    PT Goals (Current goals can be found in the Care Plan section)  Acute Rehab PT Goals Patient Stated Goal: to go to the bathroom PT Goal Formulation: With patient Time For Goal Achievement: 05/06/18 Potential to Achieve Goals: Good    Frequency Min 3X/week           AM-PAC PT "6 Clicks" Daily Activity  Outcome Measure Difficulty turning over in bed (including adjusting bedclothes, sheets and blankets)?: Unable Difficulty moving from lying on back to sitting on the side of the bed? : Unable Difficulty sitting down on and standing up from a chair with arms (e.g., wheelchair, bedside commode, etc,.)?: Unable Help needed moving to and from a bed to chair (including a wheelchair)?: A Lot Help needed walking in hospital room?: A Lot Help needed climbing 3-5 steps with a railing? : Total 6 Click Score: 8    End of Session Equipment Utilized During Treatment: Gait belt Activity  Tolerance: Patient tolerated treatment well Patient left: in bed;with call bell/phone within reach;with bed alarm set;with nursing/sitter in room(RN tech in room) Nurse Communication: Mobility status PT Visit Diagnosis: Muscle weakness (generalized) (M62.81);Difficulty in walking, not elsewhere classified (R26.2);Hemiplegia and hemiparesis Hemiplegia - Right/Left: Left Hemiplegia - dominant/non-dominant: Non-dominant Hemiplegia - caused by: Cerebral infarction    Time: 8921-1941 PT Time Calculation (min) (ACUTE ONLY): 19 min   Charges:           Wells Guiles B. Samiah Ricklefs, PT, DPT  Acute Rehabilitation #[phone] pager #[phone] office   PT Evaluation $PT Eval Moderate Complexity: 1 Mod          04/22/2018, 12:21 PM

## 2018-04-22 NOTE — H&P (Addendum)
TRH H&P   Patient Demographics:    Christine [name], is a 84 y.o. female  MRN: [id number]   DOB - Jul 23, 1922  Admit Date - 04/21/2018  Outpatient Primary MD for the patient is Lajean Manes, MD  Referring MD/NP/PA: Janetta Hora  Outpatient Specialists:  Dorris Carnes (cardiology)  Patient coming from: [facility]  Chief Complaint  Patient presents with  . Facial Droop  . Weakness      HPI:    Christine [name]  is a 84 y.o. female, w hard of hearing, ? Dementia, CHF (EF 20-25%), apparently presents with being slumped over in dining area with left sided facial droop and ? Left arm and left leg drift per RN notes.  Pt is unable to provide meaningful history at this time.    In ED,  T 98.6, P 63  Bp 150/35  Pox 95% on RA  CT brain IMPRESSION: Atrophy with small vessel chronic ischemic changes of deep cerebral white matter.  No acute intracranial abnormalities.  Etoh <10 INR 1.03 PTT 29  Wbc 8.8, Hgb 12.3, Plt 559 Na 140, K 4.4, Bun 37, Creatinine 1.46 Ast 17, Alt 15 Glucose 201  Trop 0.02 UDS negative Urinalysis wbc 11-20,  Rbc 0-5  Pt will be admitted for left facial droop ? TIA, and also UTI and diabetes. .          Review of systems:    In addition to the HPI above, unable to obtain clearly due to hard of hearing and dementia  No Fever-chills, No Headache, No changes with Vision or hearing, No problems swallowing food or Liquids, No Chest pain, Cough or Shortness of Breath, No Abdominal pain, No Nausea or Vommitting, Bowel movements are regular, No Blood in stool or Urine, No dysuria, No new skin rashes or bruises, No new joints pains-aches,  No new weakness, tingling, numbness in any extremity, No recent weight gain or [name], No polyuria, polydypsia or polyphagia, No significant Mental Stressors.  A full 10 point Review of  Systems was done, except as stated above, all other Review of Systems were negative.   With Past History of the following :    Past Medical History:  Diagnosis Date  . Arthritis   . Chronic systolic CHF (congestive heart failure) ([place]) 05/17/2017   Echo 8/18 - EF 20-25, severe diffuse HK with anteroseptal, anterior, apical AK, Doppler parameters c/w restrictive physio, no evidence of thrombus, mild to mod MR, mild LAE, PASP 33, trivial effusion, L pleural effusion  . Dilated cardiomyopathy ([place]) 05/17/2017   probably ischemic; presumed CAD given wall motion abnl on Echo 8/18      Past Surgical History:  Procedure Laterality Date  . ANTERIOR APPROACH HEMI HIP ARTHROPLASTY Left 03/28/2016   Procedure: ANTERIOR APPROACH HEMI HIP ARTHROPLASTY;  Surgeon: Dorna Leitz, MD;  Location: [facility];  Service: Orthopedics;  Laterality: Left;  Social History:     Social History   Tobacco Use  . Smoking status: Never Smoker  . Smokeless tobacco: Never Used  Substance Use Topics  . Alcohol use: No     Lives - at [facility] - unclear   Family History :     Family History  Family history unknown: Yes   Unable to obtain due to dementia.    Home Medications:   Prior to Admission medications   Medication Sig Start Date End Date Taking? Authorizing Provider  aspirin EC 81 MG tablet Take 81 mg by mouth daily.   Yes [provider]  atorvastatin (LIPITOR) 20 MG tablet Take 1 tablet (20 mg total) by mouth daily. 08/17/17 04/21/18 Yes Weaver, Scott T, PA-C  furosemide (LASIX) 40 MG tablet Take 1 tablet (40 mg total) by mouth daily. 09/21/17  Yes Fay Records, MD  glimepiride (AMARYL) 2 MG tablet Take 1 tablet (2 mg total) by mouth daily with breakfast. 08/24/17  Yes Weaver, Scott T, PA-C  losartan-hydrochlorothiazide [facility]) 50-12.5 MG tablet Take one half tablet (25/6.25) daily 03/15/18  Yes Fay Records, MD  Melatonin 3 MG TABS Take 3 mg by mouth at bedtime.   Yes [provider]  metoprolol succinate (TOPROL XL) 25 MG 24 hr tablet Take 1 tablet (25 mg total) by mouth daily. 10/19/17  Yes Fay Records, MD  senna-docusate (SENOKOT-S) 8.6-50 MG tablet Take 1 tablet by mouth at bedtime.   Yes [provider]  sitaGLIPtin (JANUVIA) 50 MG tablet Take 50 mg by mouth daily.   Yes [provider]     Allergies:    No Known Allergies   Physical Exam:   Vitals  Blood pressure (!) 150/35, pulse 63, temperature 98.6 F (37 C), resp. rate 16, height 5\' 3"  (1.6 m), weight 74.3 kg, SpO2 95 %.   1. General   lying in bed in NAD  2. Normal affect and insight, Not Suicidal or Homicidal, Awake Alert, Oriented X 1 (person).  3. No F.N deficits, ALL C.Nerves Intact, Strength 5/5 all 4 extremities, Sensation intact all 4 extremities, Plantars down going.,  no current facial droop  4. Ears and Eyes appear Normal, Conjunctivae clear, PERRLA. Moist Oral Mucosa.  5. Supple Neck, No JVD, No cervical lymphadenopathy appriciated, No Carotid Bruits.  6. Symmetrical Chest wall movement, Good air movement bilaterally, CTAB.  7. RRR, No Gallops, Rubs or Murmurs, No Parasternal Heave.  8. Positive Bowel Sounds, Abdomen Soft, No tenderness, No organomegaly appriciated,No rebound -guarding or rigidity.  9.  No Cyanosis, Normal Skin Turgor, No Skin Rash or Bruise.  10. Good muscle tone,  joints appear normal , no effusions, Normal ROM.  11. No Palpable Lymph Nodes in Neck or Axillae      Data Review:    CBC Recent Labs  Lab 04/21/18 1839 04/21/18 1849  WBC 8.8  --   HGB 12.3 12.6  HCT 38.6 37.0  PLT 559*  --   MCV 95.3  --   MCH 30.4  --   MCHC 31.9  --   RDW 14.3  --   LYMPHSABS 2.2  --   MONOABS 0.8  --   EOSABS 0.4  --   BASOSABS 0.1  --    ------------------------------------------------------------------------------------------------------------------  Chemistries  Recent Labs  Lab 04/21/18 1839 04/21/18 1849  NA 140 139    K 4.4 4.4  CL 102 101  CO2 28  --   GLUCOSE 201*  198*  BUN 37* 39*  CREATININE 1.46* 1.30*  CALCIUM 9.0  --   AST 17  --   ALT 15  --   ALKPHOS 61  --   BILITOT 0.7  --    ------------------------------------------------------------------------------------------------------------------ estimated creatinine clearance is 26.1 mL/min (A) (by C-G formula based on SCr of 1.3 mg/dL (H)). ------------------------------------------------------------------------------------------------------------------ No results for input(s): TSH, T4TOTAL, T3FREE, THYROIDAB in the last 72 hours.  Invalid input(s): FREET3  Coagulation profile Recent Labs  Lab 04/21/18 1839  INR 1.03   ------------------------------------------------------------------------------------------------------------------- No results for input(s): DDIMER in the last 72 hours. -------------------------------------------------------------------------------------------------------------------  Cardiac Enzymes No results for input(s): CKMB, TROPONINI, MYOGLOBIN in the last 168 hours.  Invalid input(s): CK ------------------------------------------------------------------------------------------------------------------ No results found for: BNP   ---------------------------------------------------------------------------------------------------------------  Urinalysis    Component Value Date/Time   COLORURINE YELLOW 04/21/2018 2019   APPEARANCEUR HAZY (A) 04/21/2018 2019   LABSPEC 1.014 04/21/2018 2019   PHURINE 6.0 04/21/2018 2019   GLUCOSEU NEGATIVE 04/21/2018 2019   HGBUR NEGATIVE 04/21/2018 2019   BILIRUBINUR NEGATIVE 04/21/2018 2019   KETONESUR NEGATIVE 04/21/2018 2019   PROTEINUR NEGATIVE 04/21/2018 2019   UROBILINOGEN 0.2 02/25/2011 1252   NITRITE NEGATIVE 04/21/2018 2019   LEUKOCYTESUR MODERATE (A) 04/21/2018 2019     ----------------------------------------------------------------------------------------------------------------   Imaging Results:    Ct Head Wo Contrast  Result Date: 04/21/2018 CLINICAL DATA:  Altered level of consciousness, slumped over in dining area, LEFT facial droop, some LEFT arm and leg drift, history CHF, dilated cardiomyopathy, hypertension, type II diabetes mellitus, stage III chronic kidney disease EXAM: CT HEAD WITHOUT CONTRAST TECHNIQUE: Contiguous axial images were obtained from the base of the skull through the vertex without intravenous contrast. Sagittal and coronal MPR images reconstructed from axial data set. COMPARISON:  None FINDINGS: Brain: Generalized atrophy. Normal ventricular morphology. No midline shift or mass effect. Small vessel chronic ischemic changes of deep cerebral white matter. No intracranial hemorrhage, mass lesion, evidence of acute infarction, or extra-axial fluid collection. Vascular: Atherosclerotic calcification of internal carotid arteries at skull base Skull: Demineralized but intact Sinuses/Orbits: Scattered opacification of a few LEFT ethmoid air cells and LEFT maxillary sinus. Other: N/A IMPRESSION: Atrophy with small vessel chronic ischemic changes of deep cerebral white matter. No acute intracranial abnormalities. Electronically Signed   By: Lavonia Dana M.D.   On: 04/21/2018 20:32   Dg Chest Portable 1 View  Result Date: 04/21/2018 CLINICAL DATA:  Left facial droop and altered mental status. EXAM: PORTABLE CHEST 1 VIEW COMPARISON:  02/22/2017 FINDINGS: 1820 hours. Low lung volumes. Asymmetric elevation right hemidiaphragm. The lungs are clear without focal pneumonia, edema, pneumothorax or pleural effusion. Cardiopericardial silhouette is at upper limits of normal for size. The visualized bony structures of the thorax are intact. Telemetry leads overlie the chest. IMPRESSION: No active disease. Electronically Signed   By: Misty Stanley M.D.   On:  04/21/2018 18:58    ekg nsr at 31, nl axis, nl int,  t inversion in 1 avl , poor R progression   Assessment & Plan:    Principal Problem:   TIA (transient ischemic attack) Active Problems:   UTI (urinary tract infection)   Type II diabetes mellitus (HCC)   Chronic systolic CHF (congestive heart failure) (HCC)   Facial droop    Left facial droop resolved ? TIA MRI/ MRA brain Carotid ultrasound Cardiac echo Check hga1c, lipid Speech , PT/OT consult Neurology consulted by Ed, appreciate input Cont aspirin Cont Lipitor  Dm2 Hold Amaryl, Januvia fsbs ac  and qhs, ISS  CHF (chronic systolic, EF 32-35%) Cont Metoprolol 25mg  po qday Cont Lasix 40mg  po qday Cont Losartan/ hydrochlorothiazide  UTI Awaiting urine culture Start Rocephin 1gm iv qday   DVT Prophylaxis - SCDs  AM Labs Ordered, also please review Full Orders  Family Communication: Admission, patients condition and plan of care including tests being ordered have been discussed with the patient  who indicate understanding and agree with the plan and Code Status.  Code Status  FULL CODE, please d/w son in AM who is her POA  Likely DC to  TBD  Condition GUARDED   Consults called: neurology by ED  Admission status: observation,  Pt requires hospitalization for left facial droop ? TIA, and also UTI and iv abx. W/up will likely take less than 2 nites , however, if patient not clinically improving may need inpatient stay  Time spent in minutes : 70   Jani Gravel M.D on 04/22/2018 at 12:08 AM  Between 7am to 7pm - Pager - [phone]  . After 7pm go to [email or web address] - password [place]  [facility] - Office  [phone]

## 2018-04-23 LAB — GLUCOSE, CAPILLARY
GLUCOSE-CAPILLARY: 245 mg/dL — AB (ref 70–99)
GLUCOSE-CAPILLARY: 182 mg/dL — AB (ref 70–99)
GLUCOSE-CAPILLARY: 154 mg/dL — AB (ref 70–99)
Glucose-Capillary: 153 mg/dL — ABNORMAL HIGH (ref 70–99)

## 2018-04-23 MED ORDER — PANTOPRAZOLE SODIUM 40 MG PO TBEC: 40.0000 mg | DELAYED_RELEASE_TABLET | Freq: Every day | ORAL | 0 refills | Status: AC

## 2018-04-23 MED ORDER — ATORVASTATIN CALCIUM 80 MG PO TABS: 80.0000 mg | ORAL_TABLET | Freq: Every day | ORAL | 0 refills | Status: AC

## 2018-04-23 MED ORDER — CLOPIDOGREL BISULFATE 75 MG PO TABS: 75.0000 mg | ORAL_TABLET | Freq: Every day | ORAL | 0 refills | Status: DC

## 2018-04-23 NOTE — Progress Notes (Signed)
Pt confused and keep pulling her tele off, received call from tele tech several time about pt tele lead been off. Put tele back on pt. Will continue to monitor for any change.

## 2018-04-23 NOTE — Evaluation (Signed)
Occupational Therapy Evaluation and Discharge Patient Details Name: Christine [name] MRN: [id number] DOB: 06/02/1922 Today's Date: 04/23/2018    History of Present Illness 84 y.o. female admitted on 04/22/18 for L facial droop and unresponsiveness.  CT negative, however, MRI showed a subcentimeter acute/early subacute infarction involving the right anterior thalamus extending into the right anterior cerebral peduncle.  Other significant PMH of dialated cardiomyopathy, dementia, chronic systolic CHF, arthritis, L anterior hip hemi (2017).     Clinical Impression   This 84 yo female admitted with above presents to acute OT with decreased balance and mobility thus affecting her safety and independence with basic ADLs. She will benefit from continued OT as staff at SNF deem appropriate based off other knowledge of how she was doing pta v. Now. Acute OT will sign off.     Follow Up Recommendations  SNF;Supervision/Assistance - 24 hour    Equipment Recommendations  None recommended by OT       Precautions / Restrictions Precautions Precautions: Fall Restrictions Weight Bearing Restrictions: No      Mobility Bed Mobility Overal bed mobility: Needs Assistance Bed Mobility: Supine to Sit;Sit to Supine     Supine to sit: Min assist;HOB elevated(increased time and VCs to use rail) Sit to supine: Mod assist(A for LEs)      Transfers Overall transfer level: Needs assistance Equipment used: 1 person hand held assist Transfers: Sit to/from [facility] Sit to Stand: Mod assist Stand pivot transfers: Mod assist            Balance Overall balance assessment: Needs assistance Sitting-balance support: No upper extremity supported;Feet supported Sitting balance-Leahy Scale: Fair Sitting balance - Comments: Able to sit EOB and wash face with only min guard A   Standing balance support: Bilateral upper extremity supported Standing balance-Leahy Scale: Poor Standing  balance comment: needs Bil UE support and support of therapist                           ADL either performed or assessed with clinical judgement   ADL Overall ADL's : Needs assistance/impaired Eating/Feeding: Set up Eating/Feeding Details (indicate cue type and reason): supported sitting Grooming: Set up;Supervision/safety Grooming Details (indicate cue type and reason): supported sitting Upper Body Bathing: Set up;Supervision/ safety Upper Body Bathing Details (indicate cue type and reason): supported sitting Lower Body Bathing: Maximal assistance Lower Body Bathing Details (indicate cue type and reason): Mod A sit<>stand Upper Body Dressing : Minimal assistance Upper Body Dressing Details (indicate cue type and reason): supported sitting Lower Body Dressing: Total assistance Lower Body Dressing Details (indicate cue type and reason): Mod A sit<>stand Toilet Transfer: Moderate assistance;Stand-pivot;BSC   Toileting- Clothing Manipulation and Hygiene: Total assistance Toileting - Clothing Manipulation Details (indicate cue type and reason): Mod A sit<>stand             Vision Patient Visual Report: No change from baseline              Pertinent Vitals/Pain Pain Assessment: No/denies pain     Hand Dominance  right   Extremity/Trunk Assessment Upper Extremity Assessment Upper Extremity Assessment: Overall WFL for tasks assessed           Communication Communication Communication: HOH(has Bil hearing aids)   Cognition Arousal/Alertness: Awake/alert Behavior During Therapy: WFL for tasks assessed/performed Overall Cognitive Status: History of cognitive impairments - at baseline  General Comments: Pt able to follow all commands              Home Living Family/patient expects to be discharged to:: Unsure(from [facility] but not sure of level of care)   Available Help at Discharge: [place]                                    Prior Functioning/Environment          Comments: No family to report        OT Problem List: Impaired balance (sitting and/or standing)         OT Goals(Current goals can be found in the care plan section) Acute Rehab OT Goals Patient Stated Goal: did not state  OT Frequency:                AM-PAC PT "6 Clicks" Daily Activity     Outcome Measure Help from another person eating meals?: A Little Help from another person taking care of personal grooming?: A Little Help from another person toileting, which includes using toliet, bedpan, or urinal?: A Lot Help from another person bathing (including washing, rinsing, drying)?: A Lot Help from another person to put on and taking off regular upper body clothing?: A Little Help from another person to put on and taking off regular lower body clothing?: Total 6 Click Score: 14   End of Session    Activity Tolerance: Patient tolerated treatment well Patient left: in bed;with call bell/phone within reach;with bed alarm set  OT Visit Diagnosis: Unsteadiness on feet (R26.81);Other abnormalities of gait and mobility (R26.89)                Time: 1740-8144 OT Time Calculation (min): 12 min Charges:  OT General Charges $OT Visit: 1 Visit OT Evaluation $OT Eval Moderate Complexity: 1 Mod  [address], OTR/L Acute [facility] [phone] Office [phone]

## 2018-04-23 NOTE — Progress Notes (Signed)
Nsg Discharge Note  Admit Date:  04/21/2018 Discharge date: 04/23/2018   Kerin Ransom Fortson to be D/C'd back to [facility] SNF per MD order.  AVS completed.  Placed in envelope for discharge with [facility] DNR form, discharge summary, and most recent labs.  Report called to Ambulance person at [facility].    Discharge Medication: Allergies as of 04/23/2018   No Known Allergies     Medication List    TAKE these medications   aspirin EC 81 MG tablet Take 81 mg by mouth daily.   atorvastatin 80 MG tablet Commonly known as:  LIPITOR Take 1 tablet (80 mg total) by mouth daily. What changed:    medication strength  how much to take   clopidogrel 75 MG tablet Commonly known as:  PLAVIX Take 1 tablet (75 mg total) by mouth daily.   furosemide 40 MG tablet Commonly known as:  LASIX Take 1 tablet (40 mg total) by mouth daily.   glimepiride 2 MG tablet Commonly known as:  AMARYL Take 1 tablet (2 mg total) by mouth daily with breakfast.   losartan-hydrochlorothiazide 50-12.5 MG tablet Commonly known as:  HYZAAR Take one half tablet (25/6.25) daily   Melatonin 3 MG Tabs Take 3 mg by mouth at bedtime.   metoprolol succinate 25 MG 24 hr tablet Commonly known as:  TOPROL-XL Take 1 tablet (25 mg total) by mouth daily.   pantoprazole 40 MG tablet Commonly known as:  PROTONIX Take 1 tablet (40 mg total) by mouth daily at 12 noon.   senna-docusate 8.6-50 MG tablet Commonly known as:  Senokot-S Take 1 tablet by mouth at bedtime.   sitaGLIPtin 50 MG tablet Commonly known as:  JANUVIA Take 50 mg by mouth daily.       Discharge Assessment: Vitals:   04/23/18 0506 04/23/18 0527  BP: (!) 183/71 (!) 143/49  Pulse: 78 75  Resp: 17   Temp: (!) 97.4 F (36.3 C)   SpO2: 96%    Skin clean, dry and intact without evidence of skin break down, no evidence of skin tears noted. IV catheter discontinued intact. Site without signs and symptoms of complications - no redness or edema noted at  insertion site, patient denies c/o pain - only slight tenderness at site.  Dressing with slight pressure applied.  D/c Instructions-Education: Discharge instructions given to PTAR on discharge with verbalized understanding. Report called to [facility] with d/c instructions including follow up instructions, medication list, d/c activities limitations if indicated, with other d/c instructions as indicated by MD. Caregiver instructed to return patient to ED, call 911, or call MD for any changes in condition.  Patient discharged from unit by [facility] and escorted back to [facility].   Ginette Pitman, RN 04/23/2018 1:28 PM

## 2018-04-23 NOTE — Progress Notes (Signed)
CSW not able to complete assessment with pt due to orientation to self .  Not able to make contact with son to complete assessment at this time.  Will update if contact able to contact son.  Reed Breech LCSWA [phone]

## 2018-04-23 NOTE — Progress Notes (Signed)
Patient will Discharge To: [place]  Anticipated DC Date:04/23/18 Family Notified: Left voice message for [employer], [phone] Transport By: Corey Harold   Per MD patient ready for DC to [facility] . RN, patient, patient's family, and facility notified of DC. Assessment, Fl2/Pasrr, and Discharge Summary sent to facility. RN given number for report ([phone], Room # 610A). DC packet on chart. Ambulance transport requested for patient.   CSW signing off.  Reed Breech LCSWA [phone]

## 2018-04-23 NOTE — Discharge Summary (Addendum)
PATIENT DETAILS Name: Christine [name] Age: 84 y.o. Sex: female Date of Birth: 1921/09/20 MRN: [id number]. Admitting Physician: Jani Gravel, MD POE:UMPNTIRWE, Christiane Ha, MD  Admit Date: 04/21/2018 Discharge date: 04/23/2018  Recommendations for Outpatient Follow-up:  1. Follow up with PCP in 1-2 weeks 2. Please obtain BMP/CBC in one week 3. Please ensure follow-up at the stroke clinic 4. Will need continued speech therapy follow-up at SNF 5. Aspirin and Plavix for 3 weeks, followed by aspirin alone  Admitted From:  SNF  Disposition: SNF   Home Health: No  Equipment/Devices: None  Discharge Condition: Stable  CODE STATUS: DNR  Diet recommendation:  Heart Healthy / Carb Modified  Dysphagia 2 (Fine chop);Nectar-thick liquid  Liquid Administration via: Straw;Cup Medication Administration: (1 at a time) Supervision: Patient able to self feed;Staff to assist with self feeding;Full supervision/cueing for compensatory strategies Compensations: Slow rate;Small sips/bites;Minimize environmental distractions Postural Changes: Seated upright at 90 degrees;Remain upright for at least 30 minutes after po intake   Brief Summary: See H&P, Labs, Consult and Test reports for all details in brief,Patient is a 84 y.o. female with prior history of dementia chronic systolic heart failure-presented with left facial droop, further work-up revealed CVA.  Brief Hospital Course: Acute CVA: Left facial droop has resolved-she does not appear to have any focal neurological deficits at this point.  LDL 173, A1c  8.3.  Transthoracic echocardiogram showed EF around 35-40%, carotid Doppler did not show any significant stenosis.  By stroke MD-recommendations are to continue with aspirin and Plavix for 3 weeks, followed by Plavix alone.  She has been changed over to a high intensity statin.  Stable for discharge.    Chronic systolic heart failure: Well compensated- continue close outpatient follow-up with  cardiology.  Hypertension: Resume all antihypertensives as previous.  DM-2: CBG stable-continue with SSI-resume oral hypoglycemic agents on discharge.  Given advanced age and chronic frailty-would not push aggressive treatment to lower A1c further.  Dementia: Does not appear to be too far from her usual baseline.  She is extremely hard of hearing as well.  Asymptomatic bacteriuria: Does not appear to have a UTI-stop Rocephin would not treat even if urine culture is positive  Procedures/Studies: Echo>> Compared to August 2018, extensive anteroapical wall motion abnormality is still present, but overall LV systolic function is better.  Discharge Diagnoses:  Principal Problem: CVA Active Problems:   UTI (urinary tract infection)   Type II diabetes mellitus (HCC)   Chronic systolic CHF (congestive heart failure) (HCC)   Facial droop   Cerebrovascular accident (CVA) due to thrombosis of right posterior cerebral artery [facility])   Discharge Instructions:  Activity:  As tolerated with Full fall precautions use walker/cane & assistance as needed   Discharge Instructions    Ambulatory referral to Neurology   Complete by:  As directed    Follow up with stroke clinic NP (Jessica Vanschaick or Cecille Rubin, if both not available, consider Zachery Dauer, or [place]) at [facility] in about 4 weeks. Thanks.   Diet - low sodium heart healthy   Complete by:  As directed    Diet Carb Modified   Complete by:  As directed    Dysphagia 2 (Fine chop);Nectar-thick liquid  Liquid Administration via: Straw;Cup Medication Administration: (1 at a time) Supervision: Patient able to self feed;Staff to assist with self feeding;Full supervision/cueing for compensatory strategies Compensations: Slow rate;Small sips/bites;Minimize environmental distractions Postural Changes: Seated upright at 90 degrees;Remain upright for at least 30 minutes after po intake   Discharge  instructions   Complete by:  As  directed    Follow with Primary MD  Lajean Manes, MD in 1 week  Follow-up with cardiology as previously scheduled  Follow-up with the stroke clinic-they will call you with an appointment  Please get a complete blood count and chemistry panel checked by your Primary MD at your next visit, and again as instructed by your Primary MD.  Get Medicines reviewed and adjusted: Please take all your medications with you for your next visit with your Primary MD  Laboratory/radiological data: Please request your Primary MD to go over all hospital tests and procedure/radiological results at the follow up, please ask your Primary MD to get all Hospital records sent to his/her office.  In some cases, they will be blood work, cultures and biopsy results pending at the time of your discharge. Please request that your primary care M.D. follows up on these results.  Also Note the following: If you experience worsening of your admission symptoms, develop shortness of breath, life threatening emergency, suicidal or homicidal thoughts you must seek medical attention immediately by calling 911 or calling your MD immediately  if symptoms less severe.  You must read complete instructions/literature along with all the possible adverse reactions/side effects for all the Medicines you take and that have been prescribed to you. Take any new Medicines after you have completely understood and accpet all the possible adverse reactions/side effects.   Do not drive when taking Pain medications or sleeping medications (Benzodaizepines)  Do not take more than prescribed Pain, Sleep and Anxiety Medications. It is not advisable to combine anxiety,sleep and pain medications without talking with your primary care practitioner  Special Instructions: If you have smoked or chewed Tobacco  in the last 2 yrs please stop smoking, stop any regular Alcohol  and or any Recreational drug use.  Wear Seat belts while driving.  Please  note: You were cared for by a hospitalist during your hospital stay. Once you are discharged, your primary care physician will handle any further medical issues. Please note that NO REFILLS for any discharge medications will be authorized once you are discharged, as it is imperative that you return to your primary care physician (or establish a relationship with a primary care physician if you do not have one) for your post hospital discharge needs so that they can reassess your need for medications and monitor your lab values.   Increase activity slowly   Complete by:  As directed      Allergies as of 04/23/2018   No Known Allergies     Medication List    TAKE these medications   aspirin EC 81 MG tablet Take 81 mg by mouth daily.   atorvastatin 80 MG tablet Commonly known as:  LIPITOR Take 1 tablet (80 mg total) by mouth daily. What changed:    medication strength  how much to take   clopidogrel 75 MG tablet Commonly known as:  PLAVIX Take 1 tablet (75 mg total) by mouth daily.   furosemide 40 MG tablet Commonly known as:  LASIX Take 1 tablet (40 mg total) by mouth daily.   glimepiride 2 MG tablet Commonly known as:  AMARYL Take 1 tablet (2 mg total) by mouth daily with breakfast.   losartan-hydrochlorothiazide 50-12.5 MG tablet Commonly known as:  HYZAAR Take one half tablet (25/6.25) daily   Melatonin 3 MG Tabs Take 3 mg by mouth at bedtime.   metoprolol succinate 25 MG 24 hr tablet Commonly known  as:  TOPROL-XL Take 1 tablet (25 mg total) by mouth daily.   pantoprazole 40 MG tablet Commonly known as:  PROTONIX Take 1 tablet (40 mg total) by mouth daily at 12 noon.   senna-docusate 8.6-50 MG tablet Commonly known as:  Senokot-S Take 1 tablet by mouth at bedtime.   sitaGLIPtin 50 MG tablet Commonly known as:  JANUVIA Take 50 mg by mouth daily.      Follow-up Information    [facility]. Schedule an appointment as soon as  possible for a visit in 4 week(s).   Specialty:  Radiology Contact information: [address] [place], [place], [place]. Schedule an appointment as soon as possible for a visit in 1 week(s).   Specialty:  Internal Medicine Contact information: [address] [place] [address] [phone]        Fay Records, MD. Schedule an appointment as soon as possible for a visit in 1 month(s).   Specialty:  Cardiology Contact information: [place] [address] [phone]          No Known Allergies  Consultations:   neurology  Other Procedures/Studies: Ct Head Wo Contrast  Result Date: 04/21/2018 CLINICAL DATA:  Altered level of consciousness, slumped over in dining area, LEFT facial droop, some LEFT arm and leg drift, history CHF, dilated cardiomyopathy, hypertension, type II diabetes mellitus, stage III chronic kidney disease EXAM: CT HEAD WITHOUT CONTRAST TECHNIQUE: Contiguous axial images were obtained from the base of the skull through the vertex without intravenous contrast. Sagittal and coronal MPR images reconstructed from axial data set. COMPARISON:  None FINDINGS: Brain: Generalized atrophy. Normal ventricular morphology. No midline shift or mass effect. Small vessel chronic ischemic changes of deep cerebral white matter. No intracranial hemorrhage, mass lesion, evidence of acute infarction, or extra-axial fluid collection. Vascular: Atherosclerotic calcification of internal carotid arteries at skull base Skull: Demineralized but intact Sinuses/Orbits: Scattered opacification of a few LEFT ethmoid air cells and LEFT maxillary sinus. Other: N/A IMPRESSION: Atrophy with small vessel chronic ischemic changes of deep cerebral white matter. No acute intracranial abnormalities. Electronically Signed   By: Lavonia Dana M.D.   On: 04/21/2018 20:32   Mr Brain Wo  Contrast  Result Date: 04/22/2018 CLINICAL DATA:  84 y/o F; lethargy, left facial droop, arm and leg drift. EXAM: MRI HEAD WITHOUT CONTRAST MRA HEAD WITHOUT CONTRAST TECHNIQUE: Multiplanar, multiecho pulse sequences of the brain and surrounding structures were obtained without intravenous contrast. Angiographic images of the head were obtained using MRA technique without contrast. COMPARISON:  None. FINDINGS: MRI HEAD FINDINGS Brain: Subcentimeter focus of reduced diffusion within the right anterior thalamus extending into the right anterior cerebral peduncle (series 5 image 49-51) compatible with acute/early subacute infarction. No associated hemorrhage or mass effect. Several nonspecific T2 FLAIR hyperintensities in subcortical and periventricular white matter are compatible with mild chronic microvascular ischemic changes for age. Mild punctate foci of susceptibility hypointensity are present within the right frontal subcortical white matter and the left thalamus compatible hemosiderin deposition of chronic microhemorrhage. Volume loss of the brain. Vascular: Normal flow voids. Skull and upper cervical spine: Normal marrow signal. Sinuses/Orbits: Moderate mucosal thickening of the left anterior ethmoid air cells and the maxillary sinus. Trace opacification of left mastoid air cells. Bilateral intra-ocular lens replacement. Other: None. MRA HEAD FINDINGS Anterior circulation: No large vessel occlusion or aneurysm. Mild proximal right  M1 stenosis and M2 superior division stenosis. Moderate left M2 inferior division and severe left M2 superior division proximal stenosis. Lumen irregularity of carotid siphons compatible with atherosclerotic disease with mild right and moderate left paraclinoid stenosis. Posterior circulation: No large vessel occlusion. Severe stenosis of the distal left vertebral artery just upstream to the vertebrobasilar junction. 2 mm outpouching at the basilar tip may represent the infundibulum  of diminutive vessel or a tiny aneurysm (series 1015, image 7). Multiple segments of mild-to-moderate stenosis in the bilateral PCA distributions. Anatomic variation: Right fetal PCA. Large right A1, large anterior communicating artery, small left A1, normal variant. IMPRESSION: 1. 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 No associated hemorrhage or mass effect. 2. Mild chronic microvascular ischemic changes and volume loss of the brain for age. 3. Left anterior ethmoid and left maxillary sinus disease. 4. Patent anterior and posterior intracranial circulation. No large vessel occlusion. 5. Basilar tip 2 mm outpouching may represent infundibular origin of diminutive vessel or aneurysm. 6. Intracranial atherosclerosis with multiple segments of stenosis in the anterior and posterior circulation. These results will be called to the ordering clinician or representative by the Radiologist Assistant, and communication documented in the PACS or zVision Dashboard. Electronically Signed   By: Kristine Garbe M.D.   On: 04/22/2018 00:21   Dg Chest Portable 1 View  Result Date: 04/21/2018 CLINICAL DATA:  Left facial droop and altered mental status. EXAM: PORTABLE CHEST 1 VIEW COMPARISON:  02/22/2017 FINDINGS: 1820 hours. Low lung volumes. Asymmetric elevation right hemidiaphragm. The lungs are clear without focal pneumonia, edema, pneumothorax or pleural effusion. Cardiopericardial silhouette is at upper limits of normal for size. The visualized bony structures of the thorax are intact. Telemetry leads overlie the chest. IMPRESSION: No active disease. Electronically Signed   By: Misty Stanley M.D.   On: 04/21/2018 18:58   Mr Jodene Nam Head Wo Contrast  Result Date: 04/22/2018 CLINICAL DATA:  84 y/o F; lethargy, left facial droop, arm and leg drift. EXAM: MRI HEAD WITHOUT CONTRAST MRA HEAD WITHOUT CONTRAST TECHNIQUE: Multiplanar,  multiecho pulse sequences of the brain and surrounding structures were obtained without intravenous contrast. Angiographic images of the head were obtained using MRA technique without contrast. COMPARISON:  None. FINDINGS: MRI HEAD FINDINGS Brain: Subcentimeter focus of reduced diffusion within the right anterior thalamus extending into the right anterior cerebral peduncle (series 5 image 49-51) compatible with acute/early subacute infarction. No associated hemorrhage or mass effect. Several nonspecific T2 FLAIR hyperintensities in subcortical and periventricular white matter are compatible with mild chronic microvascular ischemic changes for age. Mild punctate foci of susceptibility hypointensity are present within the right frontal subcortical white matter and the left thalamus compatible hemosiderin deposition of chronic microhemorrhage. Volume loss of the brain. Vascular: Normal flow voids. Skull and upper cervical spine: Normal marrow signal. Sinuses/Orbits: Moderate mucosal thickening of the left anterior ethmoid air cells and the maxillary sinus. Trace opacification of left mastoid air cells. Bilateral intra-ocular lens replacement. Other: None. MRA HEAD FINDINGS Anterior circulation: No large vessel occlusion or aneurysm. Mild proximal right M1 stenosis and M2 superior division stenosis. Moderate left M2 inferior division and severe left M2 superior division proximal stenosis. Lumen irregularity of carotid siphons compatible with atherosclerotic disease with mild right and moderate left paraclinoid stenosis. Posterior circulation: No large vessel occlusion. Severe stenosis of the distal left vertebral artery just upstream to the vertebrobasilar junction. 2 mm outpouching at the basilar tip may represent the infundibulum of  diminutive vessel or a tiny aneurysm (series 1015, image 7). Multiple segments of mild-to-moderate stenosis in the bilateral PCA distributions. Anatomic variation: Right fetal PCA. Large  right A1, large anterior communicating artery, small left A1, normal variant. IMPRESSION: 1. 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 No associated hemorrhage or mass effect. 2. Mild chronic microvascular ischemic changes and volume loss of the brain for age. 3. Left anterior ethmoid and left maxillary sinus disease. 4. Patent anterior and posterior intracranial circulation. No large vessel occlusion. 5. Basilar tip 2 mm outpouching may represent infundibular origin of diminutive vessel or aneurysm. 6. Intracranial atherosclerosis with multiple segments of stenosis in the anterior and posterior circulation. These results will be called to the ordering clinician or representative by the Radiologist Assistant, and communication documented in the PACS or zVision Dashboard. Electronically Signed   By: Kristine Garbe M.D.   On: 04/22/2018 00:21      TODAY-DAY OF DISCHARGE:  Subjective:   Hevin Strub today has no headache,no chest abdominal pain,no new weakness tingling or numbness, feels much better she is still very hard of hearing  Objective:   Blood pressure (!) 143/49, pulse 75, temperature (!) 97.4 F (36.3 C), temperature source Oral, resp. rate 17, height 5\' 3"  (1.6 m), weight 85.9 kg, SpO2 96 %.  Intake/Output Summary (Last 24 hours) at 04/23/2018 0828 Last data filed at 04/23/2018 0500 Gross per 24 hour  Intake -  Output 400 ml  Net -400 ml   Filed Weights   04/21/18 1751 04/22/18 0544  Weight: 74.3 kg 85.9 kg    Exam: Awake Alert, somewhat confused but is mostly hard of hearing, No new F.N deficits, Normal affect [place].AT,PERRAL Supple Neck,No JVD, No cervical lymphadenopathy appriciated.  Symmetrical Chest wall movement, Good air movement bilaterally, CTAB RRR,No Gallops,Rubs or new Murmurs, No Parasternal Heave +ve B.Sounds, Abd Soft, Non tender, No organomegaly appriciated, No  rebound -guarding or rigidity. No Cyanosis, Clubbing or edema, No new Rash or bruise   PERTINENT RADIOLOGIC STUDIES: Ct Head Wo Contrast  Result Date: 04/21/2018 CLINICAL DATA:  Altered level of consciousness, slumped over in dining area, LEFT facial droop, some LEFT arm and leg drift, history CHF, dilated cardiomyopathy, hypertension, type II diabetes mellitus, stage III chronic kidney disease EXAM: CT HEAD WITHOUT CONTRAST TECHNIQUE: Contiguous axial images were obtained from the base of the skull through the vertex without intravenous contrast. Sagittal and coronal MPR images reconstructed from axial data set. COMPARISON:  None FINDINGS: Brain: Generalized atrophy. Normal ventricular morphology. No midline shift or mass effect. Small vessel chronic ischemic changes of deep cerebral white matter. No intracranial hemorrhage, mass lesion, evidence of acute infarction, or extra-axial fluid collection. Vascular: Atherosclerotic calcification of internal carotid arteries at skull base Skull: Demineralized but intact Sinuses/Orbits: Scattered opacification of a few LEFT ethmoid air cells and LEFT maxillary sinus. Other: N/A IMPRESSION: Atrophy with small vessel chronic ischemic changes of deep cerebral white matter. No acute intracranial abnormalities. Electronically Signed   By: Lavonia Dana M.D.   On: 04/21/2018 20:32   Mr Brain Wo Contrast  Result Date: 04/22/2018 CLINICAL DATA:  84 y/o F; lethargy, left facial droop, arm and leg drift. EXAM: MRI HEAD WITHOUT CONTRAST MRA HEAD WITHOUT CONTRAST TECHNIQUE: Multiplanar, multiecho pulse sequences of the brain and surrounding structures were obtained without intravenous contrast. Angiographic images of the head were obtained using MRA technique without contrast. COMPARISON:  None. FINDINGS: MRI HEAD FINDINGS Brain: Subcentimeter focus of  reduced diffusion within the right anterior thalamus extending into the right anterior cerebral peduncle (series 5 image  49-51) compatible with acute/early subacute infarction. No associated hemorrhage or mass effect. Several nonspecific T2 FLAIR hyperintensities in subcortical and periventricular white matter are compatible with mild chronic microvascular ischemic changes for age. Mild punctate foci of susceptibility hypointensity are present within the right frontal subcortical white matter and the left thalamus compatible hemosiderin deposition of chronic microhemorrhage. Volume loss of the brain. Vascular: Normal flow voids. Skull and upper cervical spine: Normal marrow signal. Sinuses/Orbits: Moderate mucosal thickening of the left anterior ethmoid air cells and the maxillary sinus. Trace opacification of left mastoid air cells. Bilateral intra-ocular lens replacement. Other: None. MRA HEAD FINDINGS Anterior circulation: No large vessel occlusion or aneurysm. Mild proximal right M1 stenosis and M2 superior division stenosis. Moderate left M2 inferior division and severe left M2 superior division proximal stenosis. Lumen irregularity of carotid siphons compatible with atherosclerotic disease with mild right and moderate left paraclinoid stenosis. Posterior circulation: No large vessel occlusion. Severe stenosis of the distal left vertebral artery just upstream to the vertebrobasilar junction. 2 mm outpouching at the basilar tip may represent the infundibulum of diminutive vessel or a tiny aneurysm (series 1015, image 7). Multiple segments of mild-to-moderate stenosis in the bilateral PCA distributions. Anatomic variation: Right fetal PCA. Large right A1, large anterior communicating artery, small left A1, normal variant. IMPRESSION: 1. 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 No associated hemorrhage or mass effect. 2. Mild chronic microvascular ischemic changes and volume loss of the brain for age. 3. Left anterior ethmoid and left maxillary  sinus disease. 4. Patent anterior and posterior intracranial circulation. No large vessel occlusion. 5. Basilar tip 2 mm outpouching may represent infundibular origin of diminutive vessel or aneurysm. 6. Intracranial atherosclerosis with multiple segments of stenosis in the anterior and posterior circulation. These results will be called to the ordering clinician or representative by the Radiologist Assistant, and communication documented in the PACS or zVision Dashboard. Electronically Signed   By: Kristine Garbe M.D.   On: 04/22/2018 00:21   Dg Chest Portable 1 View  Result Date: 04/21/2018 CLINICAL DATA:  Left facial droop and altered mental status. EXAM: PORTABLE CHEST 1 VIEW COMPARISON:  02/22/2017 FINDINGS: 1820 hours. Low lung volumes. Asymmetric elevation right hemidiaphragm. The lungs are clear without focal pneumonia, edema, pneumothorax or pleural effusion. Cardiopericardial silhouette is at upper limits of normal for size. The visualized bony structures of the thorax are intact. Telemetry leads overlie the chest. IMPRESSION: No active disease. Electronically Signed   By: Misty Stanley M.D.   On: 04/21/2018 18:58   Mr Jodene Nam Head Wo Contrast  Result Date: 04/22/2018 CLINICAL DATA:  84 y/o F; lethargy, left facial droop, arm and leg drift. EXAM: MRI HEAD WITHOUT CONTRAST MRA HEAD WITHOUT CONTRAST TECHNIQUE: Multiplanar, multiecho pulse sequences of the brain and surrounding structures were obtained without intravenous contrast. Angiographic images of the head were obtained using MRA technique without contrast. COMPARISON:  None. FINDINGS: MRI HEAD FINDINGS Brain: Subcentimeter focus of reduced diffusion within the right anterior thalamus extending into the right anterior cerebral peduncle (series 5 image 49-51) compatible with acute/early subacute infarction. No associated hemorrhage or mass effect. Several nonspecific T2 FLAIR hyperintensities in subcortical and periventricular white matter  are compatible with mild chronic microvascular ischemic changes for age. Mild punctate foci of susceptibility hypointensity are present within the right frontal subcortical white matter and the left  thalamus compatible hemosiderin deposition of chronic microhemorrhage. Volume loss of the brain. Vascular: Normal flow voids. Skull and upper cervical spine: Normal marrow signal. Sinuses/Orbits: Moderate mucosal thickening of the left anterior ethmoid air cells and the maxillary sinus. Trace opacification of left mastoid air cells. Bilateral intra-ocular lens replacement. Other: None. MRA HEAD FINDINGS Anterior circulation: No large vessel occlusion or aneurysm. Mild proximal right M1 stenosis and M2 superior division stenosis. Moderate left M2 inferior division and severe left M2 superior division proximal stenosis. Lumen irregularity of carotid siphons compatible with atherosclerotic disease with mild right and moderate left paraclinoid stenosis. Posterior circulation: No large vessel occlusion. Severe stenosis of the distal left vertebral artery just upstream to the vertebrobasilar junction. 2 mm outpouching at the basilar tip may represent the infundibulum of diminutive vessel or a tiny aneurysm (series 1015, image 7). Multiple segments of mild-to-moderate stenosis in the bilateral PCA distributions. Anatomic variation: Right fetal PCA. Large right A1, large anterior communicating artery, small left A1, normal variant. IMPRESSION: 1. 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 No associated hemorrhage or mass effect. 2. Mild chronic microvascular ischemic changes and volume loss of the brain for age. 3. Left anterior ethmoid and left maxillary sinus disease. 4. Patent anterior and posterior intracranial circulation. No large vessel occlusion. 5. Basilar tip 2 mm outpouching may represent infundibular origin of diminutive vessel or  aneurysm. 6. Intracranial atherosclerosis with multiple segments of stenosis in the anterior and posterior circulation. These results will be called to the ordering clinician or representative by the Radiologist Assistant, and communication documented in the PACS or zVision Dashboard. Electronically Signed   By: Kristine Garbe M.D.   On: 04/22/2018 00:21     PERTINENT LAB RESULTS: CBC: Recent Labs    04/21/18 1839 04/21/18 1849 04/22/18 0426  WBC 8.8  --  8.8  HGB 12.3 12.6 13.7  HCT 38.6 37.0 41.7  PLT 559*  --  PLATELET CLUMPS NOTED ON SMEAR, UNABLE TO ESTIMATE   CMET CMP     Component Value Date/Time   NA 142 04/22/2018 0426   NA 137 03/25/2018 0932   K 4.2 04/22/2018 0426   CL 105 04/22/2018 0426   CO2 27 04/22/2018 0426   GLUCOSE 140 (H) 04/22/2018 0426   BUN 35 (H) 04/22/2018 0426   BUN 22 03/25/2018 0932   CREATININE 1.27 (H) 04/22/2018 0426   CALCIUM 8.9 04/22/2018 0426   PROT 6.6 04/22/2018 0426   PROT 6.4 09/29/2017 0937   ALBUMIN 3.5 04/22/2018 0426   ALBUMIN 4.0 09/29/2017 0937   AST 20 04/22/2018 0426   ALT 17 04/22/2018 0426   ALKPHOS 58 04/22/2018 0426   BILITOT 0.6 04/22/2018 0426   BILITOT 0.3 09/29/2017 0937   GFRNONAA 35 (L) 04/22/2018 0426   GFRAA 41 (L) 04/22/2018 0426    GFR Estimated Creatinine Clearance: 28.7 mL/min (A) (by C-G formula based on SCr of 1.27 mg/dL (H)). No results for input(s): LIPASE, AMYLASE in the last 72 hours. No results for input(s): CKTOTAL, CKMB, CKMBINDEX, TROPONINI in the last 72 hours. Invalid input(s): POCBNP No results for input(s): DDIMER in the last 72 hours. Recent Labs    04/22/18 0426  HGBA1C 8.3*   Recent Labs    04/22/18 0426  CHOL 241*  HDL 39*  LDLCALC 173*  TRIG 147  CHOLHDL 6.2   No results for input(s): TSH, T4TOTAL, T3FREE, THYROIDAB in the last 72 hours.  Invalid input(s): FREET3 No results  for input(s): VITAMINB12, FOLATE, FERRITIN, TIBC, IRON, RETICCTPCT in the last 72  hours. Coags: Recent Labs    04/21/18 1839  INR 1.03   Microbiology: Recent Results (from the past 240 hour(s))  Urine culture     Status: Abnormal (Preliminary result)   Collection Time: 04/21/18 10:04 PM  Result Value Ref Range Status   Specimen Description URINE, RANDOM  Final   Special Requests   Final    NONE Performed at [facility], [address] [place], [place] [address]    Culture >=100,000 COLONIES/mL GRAM NEGATIVE RODS (A)  Final   Report Status PENDING  Incomplete  MRSA PCR Screening     Status: None   Collection Time: 04/22/18  2:26 AM  Result Value Ref Range Status   MRSA by PCR NEGATIVE NEGATIVE Final    Comment:        The GeneXpert MRSA Assay (FDA approved for NASAL specimens only), is one component of a comprehensive MRSA colonization surveillance program. It is not intended to diagnose MRSA infection nor to guide or monitor treatment for MRSA infections. Performed at [facility], [place] [address], [place], [place] [address]     FURTHER DISCHARGE INSTRUCTIONS:  Get Medicines reviewed and adjusted: Please take all your medications with you for your next visit with your Primary MD  Laboratory/radiological data: Please request your Primary MD to go over all hospital tests and procedure/radiological results at the follow up, please ask your Primary MD to get all Hospital records sent to his/her office.  In some cases, they will be blood work, cultures and biopsy results pending at the time of your discharge. Please request that your primary care M.D. goes through all the records of your hospital data and follows up on these results.  Also Note the following: If you experience worsening of your admission symptoms, develop shortness of breath, life threatening emergency, suicidal or homicidal thoughts you must seek medical attention immediately by calling 911 or calling your MD immediately  if symptoms less severe.  You must read complete  instructions/literature along with all the possible adverse reactions/side effects for all the Medicines you take and that have been prescribed to you. Take any new Medicines after you have completely understood and accpet all the possible adverse reactions/side effects.   Do not drive when taking Pain medications or sleeping medications (Benzodaizepines)  Do not take more than prescribed Pain, Sleep and Anxiety Medications. It is not advisable to combine anxiety,sleep and pain medications without talking with your primary care practitioner  Special Instructions: If you have smoked or chewed Tobacco  in the last 2 yrs please stop smoking, stop any regular Alcohol  and or any Recreational drug use.  Wear Seat belts while driving.  Please note: You were cared for by a hospitalist during your hospital stay. Once you are discharged, your primary care physician will handle any further medical issues. Please note that NO REFILLS for any discharge medications will be authorized once you are discharged, as it is imperative that you return to your primary care physician (or establish a relationship with a primary care physician if you do not have one) for your post hospital discharge needs so that they can reassess your need for medications and monitor your lab values.  Total Time spent coordinating discharge including counseling, education and face to face time equals 45 minutes.  SignedOren Binet 04/23/2018 8:28 AM

## 2018-04-24 LAB — URINE CULTURE: Culture: >=100000 — AB

## 2018-04-25 LAB — GLUCOSE, CAPILLARY: Glucose-Capillary: 191 mg/dL — ABNORMAL HIGH (ref 70–99)

## 2018-05-10 ENCOUNTER — Emergency Department (HOSPITAL_COMMUNITY): Payer: Medicare Other

## 2018-05-10 ENCOUNTER — Encounter (HOSPITAL_COMMUNITY): Payer: Self-pay

## 2018-05-10 ENCOUNTER — Other Ambulatory Visit: Payer: Self-pay

## 2018-05-10 ENCOUNTER — Observation Stay (HOSPITAL_COMMUNITY)
Admission: EM | Admit: 2018-05-10 | Discharge: 2018-05-16 | Disposition: A | Discharge status: Skilled Nursing Facility | Payer: Medicare Other | Attending: Family Medicine | Admitting: Family Medicine

## 2018-05-10 DIAGNOSIS — I63331 Cerebral infarction due to thrombosis of right posterior cerebral artery: Secondary | ICD-10-CM | POA: Diagnosis present

## 2018-05-10 DIAGNOSIS — E785 Hyperlipidemia, unspecified: Secondary | ICD-10-CM | POA: Diagnosis not present

## 2018-05-10 DIAGNOSIS — Z7984 Long term (current) use of oral hypoglycemic drugs: Secondary | ICD-10-CM | POA: Insufficient documentation

## 2018-05-10 DIAGNOSIS — N183 Chronic kidney disease, stage 3 unspecified: Secondary | ICD-10-CM | POA: Diagnosis present

## 2018-05-10 DIAGNOSIS — Z96642 Presence of left artificial hip joint: Secondary | ICD-10-CM | POA: Diagnosis not present

## 2018-05-10 DIAGNOSIS — E1100 Type 2 diabetes mellitus with hyperosmolarity without nonketotic hyperglycemic-hyperosmolar coma (NKHHC): Secondary | ICD-10-CM

## 2018-05-10 DIAGNOSIS — E870 Hyperosmolality and hypernatremia: Secondary | ICD-10-CM | POA: Insufficient documentation

## 2018-05-10 DIAGNOSIS — N3000 Acute cystitis without hematuria: Secondary | ICD-10-CM

## 2018-05-10 DIAGNOSIS — G9341 Metabolic encephalopathy: Secondary | ICD-10-CM | POA: Insufficient documentation

## 2018-05-10 DIAGNOSIS — N390 Urinary tract infection, site not specified: Principal | ICD-10-CM | POA: Insufficient documentation

## 2018-05-10 DIAGNOSIS — I420 Dilated cardiomyopathy: Secondary | ICD-10-CM | POA: Diagnosis not present

## 2018-05-10 DIAGNOSIS — Z7982 Long term (current) use of aspirin: Secondary | ICD-10-CM | POA: Diagnosis not present

## 2018-05-10 DIAGNOSIS — Z66 Do not resuscitate: Secondary | ICD-10-CM | POA: Diagnosis not present

## 2018-05-10 DIAGNOSIS — E1165 Type 2 diabetes mellitus with hyperglycemia: Secondary | ICD-10-CM | POA: Diagnosis not present

## 2018-05-10 DIAGNOSIS — F0390 Unspecified dementia without behavioral disturbance: Secondary | ICD-10-CM | POA: Diagnosis not present

## 2018-05-10 DIAGNOSIS — I130 Hypertensive heart and chronic kidney disease with heart failure and stage 1 through stage 4 chronic kidney disease, or unspecified chronic kidney disease: Secondary | ICD-10-CM | POA: Diagnosis not present

## 2018-05-10 DIAGNOSIS — Z79899 Other long term (current) drug therapy: Secondary | ICD-10-CM | POA: Diagnosis not present

## 2018-05-10 DIAGNOSIS — Z515 Encounter for palliative care: Secondary | ICD-10-CM

## 2018-05-10 DIAGNOSIS — R739 Hyperglycemia, unspecified: Secondary | ICD-10-CM

## 2018-05-10 DIAGNOSIS — Z7902 Long term (current) use of antithrombotics/antiplatelets: Secondary | ICD-10-CM | POA: Insufficient documentation

## 2018-05-10 DIAGNOSIS — E860 Dehydration: Secondary | ICD-10-CM | POA: Diagnosis not present

## 2018-05-10 DIAGNOSIS — E1122 Type 2 diabetes mellitus with diabetic chronic kidney disease: Secondary | ICD-10-CM | POA: Diagnosis not present

## 2018-05-10 DIAGNOSIS — Z8673 Personal history of transient ischemic attack (TIA), and cerebral infarction without residual deficits: Secondary | ICD-10-CM | POA: Insufficient documentation

## 2018-05-10 DIAGNOSIS — Z7189 Other specified counseling: Secondary | ICD-10-CM

## 2018-05-10 DIAGNOSIS — B9620 Unspecified Escherichia coli [E. coli] as the cause of diseases classified elsewhere: Secondary | ICD-10-CM | POA: Insufficient documentation

## 2018-05-10 DIAGNOSIS — I5022 Chronic systolic (congestive) heart failure: Secondary | ICD-10-CM | POA: Insufficient documentation

## 2018-05-10 DIAGNOSIS — R531 Weakness: Secondary | ICD-10-CM

## 2018-05-10 DIAGNOSIS — R4182 Altered mental status, unspecified: Secondary | ICD-10-CM

## 2018-05-10 DIAGNOSIS — I159 Secondary hypertension, unspecified: Secondary | ICD-10-CM | POA: Diagnosis present

## 2018-05-10 DIAGNOSIS — E119 Type 2 diabetes mellitus without complications: Secondary | ICD-10-CM

## 2018-05-10 HISTORY — DX: Cerebral infarction, unspecified: I63.9

## 2018-05-10 LAB — COMPREHENSIVE METABOLIC PANEL
ALT: 14 U/L (ref 0–44)
AST: 16 U/L (ref 15–41)
Albumin: 2.9 g/dL — ABNORMAL LOW (ref 3.5–5.0)
Alkaline Phosphatase: 57 U/L (ref 38–126)
Anion gap: 11 (ref 5–15)
BUN: 41 mg/dL — AB (ref 8–23)
CHLORIDE: 102 mmol/L (ref 98–111)
CO2: 28 mmol/L (ref 22–32)
Calcium: 8.3 mg/dL — ABNORMAL LOW (ref 8.9–10.3)
Creatinine, Ser: 1.46 mg/dL — ABNORMAL HIGH (ref 0.44–1.00)
GFR calc Af Amer: 35 mL/min — ABNORMAL LOW (ref >60)
GFR calc non Af Amer: 30 mL/min — ABNORMAL LOW (ref >60)
Glucose, Bld: 384 mg/dL — ABNORMAL HIGH (ref 70–99)
Potassium: 3.7 mmol/L (ref 3.5–5.1)
SODIUM: 141 mmol/L (ref 135–145)
Total Bilirubin: 0.4 mg/dL (ref 0.3–1.2)
Total Protein: 6.1 g/dL — ABNORMAL LOW (ref 6.5–8.1)

## 2018-05-10 LAB — GLUCOSE, CAPILLARY
Glucose-Capillary: 65 mg/dL — ABNORMAL LOW (ref 70–99)
Glucose-Capillary: 94 mg/dL (ref 70–99)

## 2018-05-10 LAB — URINALYSIS, ROUTINE W REFLEX MICROSCOPIC
Bilirubin Urine: NEGATIVE
Glucose, UA: >=500 mg/dL — AB
Hgb urine dipstick: NEGATIVE
Ketones, ur: NEGATIVE mg/dL
Nitrite: POSITIVE — AB
Protein, ur: NEGATIVE mg/dL
Specific Gravity, Urine: 1.006 (ref 1.005–1.030)
WBC, UA: >50 WBC/hpf — ABNORMAL HIGH (ref 0–5)
pH: 6 (ref 5.0–8.0)

## 2018-05-10 LAB — CBC
HEMATOCRIT: 33.5 % — AB (ref 36.0–46.0)
Hemoglobin: 11.2 g/dL — ABNORMAL LOW (ref 12.0–15.0)
MCH: 30.6 pg (ref 26.0–34.0)
MCHC: 33.4 g/dL (ref 30.0–36.0)
MCV: 91.5 fL (ref 78.0–100.0)
Platelets: 510 10*3/uL — ABNORMAL HIGH (ref 150–400)
RBC: 3.66 MIL/uL — ABNORMAL LOW (ref 3.87–5.11)
RDW: 13.8 % (ref 11.5–15.5)
WBC: 8.9 10*3/uL (ref 4.0–10.5)

## 2018-05-10 LAB — CBG MONITORING, ED
GLUCOSE-CAPILLARY: 286 mg/dL — AB (ref 70–99)
Glucose-Capillary: 354 mg/dL — ABNORMAL HIGH (ref 70–99)

## 2018-05-10 MED ORDER — SODIUM CHLORIDE 0.9 % IV SOLN
INTRAVENOUS | Status: DC
  Administered 2018-05-10 – 2018-05-11 (×2): via INTRAVENOUS

## 2018-05-10 MED ORDER — DEXTROSE 50 % IV SOLN
25.0000 mL | Freq: Once | INTRAVENOUS | Status: AC
  Administered 2018-05-10: 25 mL via INTRAVENOUS
  Filled 2018-05-10: qty 50

## 2018-05-10 MED ORDER — HYDROCODONE-ACETAMINOPHEN 5-325 MG PO TABS
1.0000 | ORAL_TABLET | ORAL | Status: DC | PRN
  Administered 2018-05-12: 1 via ORAL
  Filled 2018-05-10: qty 1

## 2018-05-10 MED ORDER — SODIUM CHLORIDE 0.9 % IV SOLN
1.0000 g | INTRAVENOUS | Status: DC
  Administered 2018-05-11 – 2018-05-14 (×4): 1 g via INTRAVENOUS
  Filled 2018-05-10: qty 1
  Filled 2018-05-10: qty 10
  Filled 2018-05-10 (×3): qty 1

## 2018-05-10 MED ORDER — SODIUM CHLORIDE 0.9 % IV BOLUS
1000.0000 mL | Freq: Once | INTRAVENOUS | Status: AC
  Administered 2018-05-10: 1000 mL via INTRAVENOUS

## 2018-05-10 MED ORDER — BISACODYL 5 MG PO TBEC: 5.0000 mg | DELAYED_RELEASE_TABLET | Freq: Every day | ORAL | Status: DC | PRN

## 2018-05-10 MED ORDER — INSULIN ASPART 100 UNIT/ML ~~LOC~~ SOLN
0.0000 [IU] | Freq: Three times a day (TID) | SUBCUTANEOUS | Status: DC
  Administered 2018-05-11: 3 [IU] via SUBCUTANEOUS
  Administered 2018-05-11: 5 [IU] via SUBCUTANEOUS
  Administered 2018-05-11: 3 [IU] via SUBCUTANEOUS
  Administered 2018-05-12: 8 [IU] via SUBCUTANEOUS
  Administered 2018-05-12: 3 [IU] via SUBCUTANEOUS
  Administered 2018-05-12: 5 [IU] via SUBCUTANEOUS
  Administered 2018-05-13: 3 [IU] via SUBCUTANEOUS
  Administered 2018-05-13 (×2): 8 [IU] via SUBCUTANEOUS
  Administered 2018-05-14: 3 [IU] via SUBCUTANEOUS
  Administered 2018-05-14: 8 [IU] via SUBCUTANEOUS
  Administered 2018-05-14 – 2018-05-15 (×2): 5 [IU] via SUBCUTANEOUS
  Administered 2018-05-15: 3 [IU] via SUBCUTANEOUS
  Administered 2018-05-15 – 2018-05-16 (×3): 5 [IU] via SUBCUTANEOUS

## 2018-05-10 MED ORDER — ACETAMINOPHEN 650 MG RE SUPP: 650.0000 mg | Freq: Four times a day (QID) | RECTAL | Status: DC | PRN

## 2018-05-10 MED ORDER — INSULIN ASPART 100 UNIT/ML ~~LOC~~ SOLN
10.0000 [IU] | Freq: Once | SUBCUTANEOUS | Status: AC
  Administered 2018-05-10: 10 [IU] via SUBCUTANEOUS
  Filled 2018-05-10: qty 1

## 2018-05-10 MED ORDER — SODIUM CHLORIDE 0.9 % IV SOLN
1.0000 g | Freq: Once | INTRAVENOUS | Status: AC
  Administered 2018-05-10: 1 g via INTRAVENOUS
  Filled 2018-05-10: qty 10

## 2018-05-10 MED ORDER — HEPARIN SODIUM (PORCINE) 5000 UNIT/ML IJ SOLN
5000.0000 [IU] | Freq: Three times a day (TID) | INTRAMUSCULAR | Status: DC
  Administered 2018-05-10 – 2018-05-16 (×17): 5000 [IU] via SUBCUTANEOUS
  Filled 2018-05-10 (×17): qty 1

## 2018-05-10 MED ORDER — SENNOSIDES-DOCUSATE SODIUM 8.6-50 MG PO TABS: 1.0000 | ORAL_TABLET | Freq: Every evening | ORAL | Status: DC | PRN

## 2018-05-10 MED ORDER — HYDRALAZINE HCL 20 MG/ML IJ SOLN
10.0000 mg | INTRAMUSCULAR | Status: DC | PRN
  Administered 2018-05-13: 10 mg via INTRAVENOUS
  Filled 2018-05-10 (×2): qty 1

## 2018-05-10 MED ORDER — ONDANSETRON HCL 4 MG PO TABS: 4.0000 mg | ORAL_TABLET | Freq: Four times a day (QID) | ORAL | Status: DC | PRN

## 2018-05-10 MED ORDER — ACETAMINOPHEN 325 MG PO TABS: 650.0000 mg | ORAL_TABLET | Freq: Four times a day (QID) | ORAL | Status: DC | PRN

## 2018-05-10 MED ORDER — ONDANSETRON HCL 4 MG/2ML IJ SOLN: 4.0000 mg | Freq: Four times a day (QID) | INTRAMUSCULAR | Status: DC | PRN

## 2018-05-10 NOTE — ED Provider Notes (Signed)
[place] DEPT Provider Note   CSN: [id number] Arrival date & time: 05/10/18  1226     History   Chief Complaint Chief Complaint  Patient presents with  . Weakness    HPI Christine [name] is a 84 y.o. female.  Pt with hx dementia, presents with increased weakness, poor po intake, decreased verbal responsiveness in the past few days. Pt is currently not verbally responsive to questions - level 5 caveat. No report of trauma/fall. No fevers.  No noted change in meds.   The history is provided by the patient, a relative and the EMS personnel. The history is limited by the condition of the patient.  Weakness     Past Medical History:  Diagnosis Date  . Arthritis   . Chronic systolic CHF (congestive heart failure) ([place]) 05/17/2017   Echo 8/18 - EF 20-25, severe diffuse HK with anteroseptal, anterior, apical AK, Doppler parameters c/w restrictive physio, no evidence of thrombus, mild to mod MR, mild LAE, PASP 33, trivial effusion, L pleural effusion  . Dementia ([place])   . Dilated cardiomyopathy ([place]) 05/17/2017   probably ischemic; presumed CAD given wall motion abnl on Echo 8/18  . Stroke [facility])     Patient Active Problem List   Diagnosis Date Noted  . Facial droop 04/22/2018  . Cerebrovascular accident (CVA) due to thrombosis of right posterior cerebral artery ([place])   . TIA (transient ischemic attack) 04/21/2018  . Hyperlipidemia 12/06/2017  . CKD (chronic kidney disease) stage 3, GFR 30-59 ml/min (HCC) 12/06/2017  . Chronic systolic CHF (congestive heart failure) ([place]) 05/17/2017  . Dilated cardiomyopathy ([place]) 05/17/2017  . Femoral neck fracture, left, closed, initial encounter 03/28/2016  . UTI (urinary tract infection) 03/28/2016  . Hypertension, secondary 03/28/2016  . Hyponatremia 03/28/2016  . Leukocytosis 03/28/2016  . Thrombocytosis ([place]) 03/28/2016  . Type II diabetes mellitus ([place]) 03/28/2016  . Hip fracture ([place]) 03/28/2016     Past Surgical History:  Procedure Laterality Date  . ANTERIOR APPROACH HEMI HIP ARTHROPLASTY Left 03/28/2016   Procedure: ANTERIOR APPROACH HEMI HIP ARTHROPLASTY;  Surgeon: Dorna Leitz, MD;  Location: [facility] ORS;  Service: Orthopedics;  Laterality: Left;     OB History   None      Home Medications    Prior to Admission medications   Medication Sig Start Date End Date Taking? Authorizing Provider  aspirin EC 81 MG tablet Take 81 mg by mouth daily.    [provider]  atorvastatin (LIPITOR) 80 MG tablet Take 1 tablet (80 mg total) by mouth daily. 04/23/18 07/22/18  GhimireHenreitta Leber, MD  clopidogrel (PLAVIX) 75 MG tablet Take 1 tablet (75 mg total) by mouth daily. 04/23/18   Ghimire, Henreitta Leber, MD  furosemide (LASIX) 40 MG tablet Take 1 tablet (40 mg total) by mouth daily. 09/21/17   Fay Records, MD  glimepiride (AMARYL) 2 MG tablet Take 1 tablet (2 mg total) by mouth daily with breakfast. 08/24/17   Richardson Dopp T, PA-C  losartan-hydrochlorothiazide [facility]) 50-12.5 MG tablet Take one half tablet (25/6.25) daily 03/15/18   Fay Records, MD  Melatonin 3 MG TABS Take 3 mg by mouth at bedtime.    [provider]  metoprolol succinate (TOPROL XL) 25 MG 24 hr tablet Take 1 tablet (25 mg total) by mouth daily. 10/19/17   Fay Records, MD  pantoprazole (PROTONIX) 40 MG tablet Take 1 tablet (40 mg total) by mouth daily at 12 noon. 04/23/18   Ghimire,  Henreitta Leber, MD  senna-docusate (SENOKOT-S) 8.6-50 MG tablet Take 1 tablet by mouth at bedtime.    [provider]  sitaGLIPtin (JANUVIA) 50 MG tablet Take 50 mg by mouth daily.    [provider]    Family History Family History  Family history unknown: Yes    Social History Social History   Tobacco Use  . Smoking status: Never Smoker  . Smokeless tobacco: Never Used  Substance Use Topics  . Alcohol use: No  . Drug use: No     Allergies   Patient has no known allergies.   Review of  Systems Review of Systems  Unable to perform ROS: Patient unresponsive  Neurological: Positive for weakness.  level 5 caveat - pt not verbally responsive   Physical Exam Updated Vital Signs BP (!) 157/49 (BP Location: Right Arm)   Pulse 60   Temp 98.6 F (37 C) (Rectal)   Resp 14   Ht 1.6 m (5\' 3" )   Wt 70.3 kg   SpO2 97%   BMI 27.46 kg/m   Physical Exam  Constitutional: She appears well-developed and well-nourished.  HENT:  Head: Atraumatic.  Dry mucous membranes.   Eyes: Pupils are equal, round, and reactive to light. Conjunctivae are normal. No scleral icterus.  Neck: Neck supple. No tracheal deviation present. No thyromegaly present.  No stiffness or rigidity. No bruits.   Cardiovascular: Normal rate, regular rhythm, normal heart sounds and intact distal pulses. Exam reveals no gallop and no friction rub.  No murmur heard. Pulmonary/Chest: Effort normal and breath sounds normal. No respiratory distress.  Abdominal: Soft. Normal appearance and bowel sounds are normal. She exhibits no distension. There is no tenderness.  Genitourinary:  Genitourinary Comments: No cva tenderness.  Musculoskeletal: She exhibits no edema.  Neurological: She is alert.  Resting. Easily aroused. Hoh. Moves bil extremities purposefully.   Skin: Skin is warm and dry. No rash noted.  Psychiatric:  Resting, easily aroused.   Nursing note and vitals reviewed.    ED Treatments / Results  Labs (all labs ordered are listed, but only abnormal results are displayed) Results for orders placed or performed during the hospital encounter of 05/10/18  CBC  Result Value Ref Range   WBC 8.9 4.0 - 10.5 K/uL   RBC 3.66 (L) 3.87 - 5.11 MIL/uL   Hemoglobin 11.2 (L) 12.0 - 15.0 g/dL   HCT 33.5 (L) 36.0 - 46.0 %   MCV 91.5 78.0 - 100.0 fL   MCH 30.6 26.0 - 34.0 pg   MCHC 33.4 30.0 - 36.0 g/dL   RDW 13.8 11.5 - 15.5 %   Platelets 510 (H) 150 - 400 K/uL  Urinalysis, Routine w reflex microscopic  Result  Value Ref Range   Color, Urine YELLOW YELLOW   APPearance HAZY (A) CLEAR   Specific Gravity, Urine 1.006 1.005 - 1.030   pH 6.0 5.0 - 8.0   Glucose, UA >=500 (A) NEGATIVE mg/dL   Hgb urine dipstick NEGATIVE NEGATIVE   Bilirubin Urine NEGATIVE NEGATIVE   Ketones, ur NEGATIVE NEGATIVE mg/dL   Protein, ur NEGATIVE NEGATIVE mg/dL   Nitrite POSITIVE (A) NEGATIVE   Leukocytes, UA LARGE (A) NEGATIVE   RBC / HPF 0-5 0 - 5 RBC/hpf   WBC, UA >50 (H) 0 - 5 WBC/hpf   Bacteria, UA RARE (A) NONE SEEN   Mucus PRESENT    Hyaline Casts, UA PRESENT   Comprehensive metabolic panel  Result Value Ref Range   Sodium 141 135 -  145 mmol/L   Potassium 3.7 3.5 - 5.1 mmol/L   Chloride 102 98 - 111 mmol/L   CO2 28 22 - 32 mmol/L   Glucose, Bld 384 (H) 70 - 99 mg/dL   BUN 41 (H) 8 - 23 mg/dL   Creatinine, Ser 1.46 (H) 0.44 - 1.00 mg/dL   Calcium 8.3 (L) 8.9 - 10.3 mg/dL   Total Protein 6.1 (L) 6.5 - 8.1 g/dL   Albumin 2.9 (L) 3.5 - 5.0 g/dL   AST 16 15 - 41 U/L   ALT 14 0 - 44 U/L   Alkaline Phosphatase 57 38 - 126 U/L   Total Bilirubin 0.4 0.3 - 1.2 mg/dL   GFR calc non Af Amer 30 (L) >60 mL/min   GFR calc Af Amer 35 (L) >60 mL/min   Anion gap 11 5 - 15  CBG monitoring, ED  Result Value Ref Range   Glucose-Capillary 354 (H) 70 - 99 mg/dL   Ct Head Wo Contrast  Result Date: 05/10/2018 CLINICAL DATA:  84 year old female with altered mental status, nonverbal. EXAM: CT HEAD WITHOUT CONTRAST TECHNIQUE: Contiguous axial images were obtained from the base of the skull through the vertex without intravenous contrast. COMPARISON:  Head CT, brain MRI and intracranial MRA 04/21/2018. FINDINGS: Brain: The small lacunar infarct at the ventral right thalamus seen on the MRI last month remains occult by CT. Gray-white matter differentiation appears stable with no midline shift, ventriculomegaly, mass effect, evidence of mass lesion, intracranial hemorrhage or evidence of cortically based acute infarction. Vascular:  Calcified atherosclerosis at the skull base. No suspicious intracranial vascular hyperdensity. Skull: No acute osseous abnormality identified. Sinuses/Orbits: Stable left maxillary sinus disease, well pneumatized otherwise. Other: Stable orbit and scalp soft tissues. IMPRESSION: 1. No acute intracranial abnormality identified, stable non contrast CT appearance of the brain. 2. Small right thalamic lacune seen on the 04/21/2018 MRI remains occult by CT. Electronically Signed   By: Genevie Ann M.D.   On: 05/10/2018 13:30   Ct Head Wo Contrast  Result Date: 04/21/2018 CLINICAL DATA:  Altered level of consciousness, slumped over in dining area, LEFT facial droop, some LEFT arm and leg drift, history CHF, dilated cardiomyopathy, hypertension, type II diabetes mellitus, stage III chronic kidney disease EXAM: CT HEAD WITHOUT CONTRAST TECHNIQUE: Contiguous axial images were obtained from the base of the skull through the vertex without intravenous contrast. Sagittal and coronal MPR images reconstructed from axial data set. COMPARISON:  None FINDINGS: Brain: Generalized atrophy. Normal ventricular morphology. No midline shift or mass effect. Small vessel chronic ischemic changes of deep cerebral white matter. No intracranial hemorrhage, mass lesion, evidence of acute infarction, or extra-axial fluid collection. Vascular: Atherosclerotic calcification of internal carotid arteries at skull base Skull: Demineralized but intact Sinuses/Orbits: Scattered opacification of a few LEFT ethmoid air cells and LEFT maxillary sinus. Other: N/A IMPRESSION: Atrophy with small vessel chronic ischemic changes of deep cerebral white matter. No acute intracranial abnormalities. Electronically Signed   By: Lavonia Dana M.D.   On: 04/21/2018 20:32   Mr Brain Wo Contrast  Result Date: 04/22/2018 CLINICAL DATA:  84 y/o F; lethargy, left facial droop, arm and leg drift. EXAM: MRI HEAD WITHOUT CONTRAST MRA HEAD WITHOUT CONTRAST TECHNIQUE:  Multiplanar, multiecho pulse sequences of the brain and surrounding structures were obtained without intravenous contrast. Angiographic images of the head were obtained using MRA technique without contrast. COMPARISON:  None. FINDINGS: MRI HEAD FINDINGS Brain: Subcentimeter focus of reduced diffusion within the right anterior thalamus extending  into the right anterior cerebral peduncle (series 5 image [id number]) compatible with acute/early subacute infarction. No associated hemorrhage or mass effect. Several nonspecific T2 FLAIR hyperintensities in subcortical and periventricular white matter are compatible with mild chronic microvascular ischemic changes for age. Mild punctate foci of susceptibility hypointensity are present within the right frontal subcortical white matter and the left thalamus compatible hemosiderin deposition of chronic microhemorrhage. Volume loss of the brain. Vascular: Normal flow voids. Skull and upper cervical spine: Normal marrow signal. Sinuses/Orbits: Moderate mucosal thickening of the left anterior ethmoid air cells and the maxillary sinus. Trace opacification of left mastoid air cells. Bilateral intra-ocular lens replacement. Other: None. MRA HEAD FINDINGS Anterior circulation: No large vessel occlusion or aneurysm. Mild proximal right M1 stenosis and M2 superior division stenosis. Moderate left M2 inferior division and severe left M2 superior division proximal stenosis. Lumen irregularity of carotid siphons compatible with atherosclerotic disease with mild right and moderate left paraclinoid stenosis. Posterior circulation: No large vessel occlusion. Severe stenosis of the distal left vertebral artery just upstream to the vertebrobasilar junction. 2 mm outpouching at the basilar tip may represent the infundibulum of diminutive vessel or a tiny aneurysm (series 1015, image 7). Multiple segments of mild-to-moderate stenosis in the bilateral PCA distributions. Anatomic variation: Right fetal  PCA. Large right A1, large anterior communicating artery, small left A1, normal variant. IMPRESSION: 1. 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 No associated hemorrhage or mass effect. 2. Mild chronic microvascular ischemic changes and volume loss of the brain for age. 3. Left anterior ethmoid and left maxillary sinus disease. 4. Patent anterior and posterior intracranial circulation. No large vessel occlusion. 5. Basilar tip 2 mm outpouching may represent infundibular origin of diminutive vessel or aneurysm. 6. Intracranial atherosclerosis with multiple segments of stenosis in the anterior and posterior circulation. These results will be called to the ordering clinician or representative by the Radiologist Assistant, and communication documented in the PACS or zVision Dashboard. Electronically Signed   By: Kristine Garbe M.D.   On: 04/22/2018 00:21   Dg Chest Port 1 View  Result Date: 05/10/2018 CLINICAL DATA:  Weakness EXAM: PORTABLE CHEST 1 VIEW COMPARISON:  04/21/2018 FINDINGS: Mild cardiomegaly. Low lung volumes. No confluent airspace opacities, effusions or edema. No acute bony abnormality. IMPRESSION: Cardiomegaly.  Low lung volumes.  No active disease. Electronically Signed   By: Rolm Baptise M.D.   On: 05/10/2018 13:11   Dg Chest Portable 1 View  Result Date: 04/21/2018 CLINICAL DATA:  Left facial droop and altered mental status. EXAM: PORTABLE CHEST 1 VIEW COMPARISON:  02/22/2017 FINDINGS: 1820 hours. Low lung volumes. Asymmetric elevation right hemidiaphragm. The lungs are clear without focal pneumonia, edema, pneumothorax or pleural effusion. Cardiopericardial silhouette is at upper limits of normal for size. The visualized bony structures of the thorax are intact. Telemetry leads overlie the chest. IMPRESSION: No active disease. Electronically Signed   By: Misty Stanley M.D.   On: 04/21/2018 18:58    Mr Jodene Nam Head Wo Contrast  Result Date: 04/22/2018 CLINICAL DATA:  84 y/o F; lethargy, left facial droop, arm and leg drift. EXAM: MRI HEAD WITHOUT CONTRAST MRA HEAD WITHOUT CONTRAST TECHNIQUE: Multiplanar, multiecho pulse sequences of the brain and surrounding structures were obtained without intravenous contrast. Angiographic images of the head were obtained using MRA technique without contrast. COMPARISON:  None. FINDINGS: MRI HEAD FINDINGS Brain: Subcentimeter focus of reduced diffusion within the right anterior thalamus extending into the right anterior cerebral  peduncle (series 5 image 49-51) compatible with acute/early subacute infarction. No associated hemorrhage or mass effect. Several nonspecific T2 FLAIR hyperintensities in subcortical and periventricular white matter are compatible with mild chronic microvascular ischemic changes for age. Mild punctate foci of susceptibility hypointensity are present within the right frontal subcortical white matter and the left thalamus compatible hemosiderin deposition of chronic microhemorrhage. Volume loss of the brain. Vascular: Normal flow voids. Skull and upper cervical spine: Normal marrow signal. Sinuses/Orbits: Moderate mucosal thickening of the left anterior ethmoid air cells and the maxillary sinus. Trace opacification of left mastoid air cells. Bilateral intra-ocular lens replacement. Other: None. MRA HEAD FINDINGS Anterior circulation: No large vessel occlusion or aneurysm. Mild proximal right M1 stenosis and M2 superior division stenosis. Moderate left M2 inferior division and severe left M2 superior division proximal stenosis. Lumen irregularity of carotid siphons compatible with atherosclerotic disease with mild right and moderate left paraclinoid stenosis. Posterior circulation: No large vessel occlusion. Severe stenosis of the distal left vertebral artery just upstream to the vertebrobasilar junction. 2 mm outpouching at the basilar tip may  represent the infundibulum of diminutive vessel or a tiny aneurysm (series 1015, image 7). Multiple segments of mild-to-moderate stenosis in the bilateral PCA distributions. Anatomic variation: Right fetal PCA. Large right A1, large anterior communicating artery, small left A1, normal variant. IMPRESSION: 1. 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 No associated hemorrhage or mass effect. 2. Mild chronic microvascular ischemic changes and volume loss of the brain for age. 3. Left anterior ethmoid and left maxillary sinus disease. 4. Patent anterior and posterior intracranial circulation. No large vessel occlusion. 5. Basilar tip 2 mm outpouching may represent infundibular origin of diminutive vessel or aneurysm. 6. Intracranial atherosclerosis with multiple segments of stenosis in the anterior and posterior circulation. These results will be called to the ordering clinician or representative by the Radiologist Assistant, and communication documented in the PACS or zVision Dashboard. Electronically Signed   By: Kristine Garbe M.D.   On: 04/22/2018 00:21    EKG None  Radiology Ct Head Wo Contrast  Result Date: 05/10/2018 CLINICAL DATA:  84 year old female with altered mental status, nonverbal. EXAM: CT HEAD WITHOUT CONTRAST TECHNIQUE: Contiguous axial images were obtained from the base of the skull through the vertex without intravenous contrast. COMPARISON:  Head CT, brain MRI and intracranial MRA 04/21/2018. FINDINGS: Brain: The small lacunar infarct at the ventral right thalamus seen on the MRI last month remains occult by CT. Gray-white matter differentiation appears stable with no midline shift, ventriculomegaly, mass effect, evidence of mass lesion, intracranial hemorrhage or evidence of cortically based acute infarction. Vascular: Calcified atherosclerosis at the skull base. No suspicious intracranial  vascular hyperdensity. Skull: No acute osseous abnormality identified. Sinuses/Orbits: Stable left maxillary sinus disease, well pneumatized otherwise. Other: Stable orbit and scalp soft tissues. IMPRESSION: 1. No acute intracranial abnormality identified, stable non contrast CT appearance of the brain. 2. Small right thalamic lacune seen on the 04/21/2018 MRI remains occult by CT. Electronically Signed   By: Genevie Ann M.D.   On: 05/10/2018 13:30   Dg Chest Port 1 View  Result Date: 05/10/2018 CLINICAL DATA:  Weakness EXAM: PORTABLE CHEST 1 VIEW COMPARISON:  04/21/2018 FINDINGS: Mild cardiomegaly. Low lung volumes. No confluent airspace opacities, effusions or edema. No acute bony abnormality. IMPRESSION: Cardiomegaly.  Low lung volumes.  No active disease. Electronically Signed   By: Rolm Baptise M.D.   On: 05/10/2018 13:11  Procedures Procedures (including critical care time)  Medications Ordered in ED Medications  sodium chloride 0.9 % bolus 1,000 mL (1,000 mLs Intravenous New Bag/Given 05/10/18 1304)     Initial Impression / Assessment and Plan / ED Course  I have reviewed the triage vital signs and the nursing notes.  Pertinent labs & imaging results that were available during my care of the patient were reviewed by me and considered in my medical decision making (see chart for details).  Iv ns. Labs. Reviewed nursing notes and prior charts for additional history.   Iv ns bolus.   novolog sq.   Ct reviewed - no acute process.  Labs reviewed - glucose very high. ua c/w uti. Urine culture sent. Rocephin iv.   Given dehydration, very poor po intake/dry mucous membranes, hyperglycemia, altered mental status - will admit.  hospitalists consulted for admission.    Final Clinical Impressions(s) / ED Diagnoses   Final diagnoses:  None    ED Discharge Orders    None       Lajean Saver, MD 05/10/18 1426

## 2018-05-10 NOTE — Progress Notes (Signed)
Hypoglycemic Event  CBG: 65  Treatment: D50 IV 25 mL  Symptoms: None  Follow-up CBG: Time: CBG Result:  Possible Reasons for Event: Inadequate meal intake  Comments/MD notified: Night shift RN was informed to recheck    Jorene Minors

## 2018-05-10 NOTE — H&P (Signed)
History and Physical    Christine [name] TIW:[id number] DOB: Oct 16, 1921 DOA: 05/10/2018  PCP: Christine Manes, MD Patient coming from: Christine [name]  Chief Complaint: Change in mental status  HPI: Christine [name] is a 84 y.o. female with medical history significant of with history of systolic congestive heart failure ejection fraction 35%, dementia, diabetes mellitus type 2, essential hypertension, recent CVA, CKD stage III was brought to the hospital for evaluation of change in mental status.  Patient was here about 3 weeks ago and discharged on 9/14 after being treated for acute CVA.  Speech and swallow recommended dysphagia 2 diet at that time.  After going to her nursing facility she was doing well at first but over the course of last week or so she has become more bedbound and drowsy.  Granddaughter noted yesterday she appeared dehydrated and very drowsy.  She requested fluid but apparently the nursing home did not think she was dehydrated according to the daughter.  This morning patient was more somnolent therefore brought to the hospital for evaluation.  Denied any fevers, chills and other complaints.  In the ER patient was somnolent but was grossly moving all the extremities.  Clinically she appeared dehydrated.  CT of the head was negative for any acute pathology.  UA was suggestive of urinary tract infection therefore started on Rocephin and medical team was requested to admit the patient.   Review of Systems: As per HPI otherwise 10 point review of systems negative.  Review of Systems Difficult to obtain this from the patient because of her mentation  Past Medical History:  Diagnosis Date  . Arthritis   . Chronic systolic CHF (congestive heart failure) ([place]) 05/17/2017   Echo 8/18 - EF 20-25, severe diffuse HK with anteroseptal, anterior, apical AK, Doppler parameters c/w restrictive physio, no evidence of thrombus, mild to mod MR, mild LAE, PASP 33, trivial effusion, L pleural effusion   . Dementia ([place])   . Dilated cardiomyopathy ([place]) 05/17/2017   probably ischemic; presumed CAD given wall motion abnl on Echo 8/18  . Stroke [facility])     Past Surgical History:  Procedure Laterality Date  . ANTERIOR APPROACH HEMI HIP ARTHROPLASTY Left 03/28/2016   Procedure: ANTERIOR APPROACH HEMI HIP ARTHROPLASTY;  Surgeon: Dorna Leitz, MD;  Location: [facility];  Service: Orthopedics;  Laterality: Left;    SOCIAL HISTORY:  reports that she has never smoked. She has never used smokeless tobacco. She reports that she does not drink alcohol or use drugs.  No Known Allergies  FAMILY HISTORY: Maternal history of diabetes mellitus type 2 Family History  Family history unknown: Yes     Prior to Admission medications   Medication Sig Start Date End Date Taking? Authorizing Provider  aspirin EC 81 MG tablet Take 81 mg by mouth daily.   Yes [provider]  atorvastatin (LIPITOR) 80 MG tablet Take 1 tablet (80 mg total) by mouth daily. 04/23/18 07/22/18 Yes Ghimire, Henreitta Leber, MD  clopidogrel (PLAVIX) 75 MG tablet Take 1 tablet (75 mg total) by mouth daily. 04/23/18  Yes Ghimire, Henreitta Leber, MD  furosemide (LASIX) 40 MG tablet Take 1 tablet (40 mg total) by mouth daily. 09/21/17  Yes Fay Records, MD  glimepiride (AMARYL) 2 MG tablet Take 1 tablet (2 mg total) by mouth daily with breakfast. 08/24/17  Yes Richardson Dopp T, PA-C  losartan-hydrochlorothiazide [facility]) 50-12.5 MG tablet Take one half tablet (25/6.25) daily 03/15/18  Yes Fay Records, MD  Melatonin 3 MG  TABS Take 3 mg by mouth at bedtime.   Yes [provider]  metoprolol succinate (TOPROL XL) 25 MG 24 hr tablet Take 1 tablet (25 mg total) by mouth daily. 10/19/17  Yes Fay Records, MD  pantoprazole (PROTONIX) 40 MG tablet Take 1 tablet (40 mg total) by mouth daily at 12 noon. 04/23/18  Yes Ghimire, Henreitta Leber, MD  senna-docusate (SENOKOT-S) 8.6-50 MG tablet Take 1 tablet by mouth at bedtime.   Yes [provider]    sitaGLIPtin (JANUVIA) 50 MG tablet Take 50 mg by mouth daily.   Yes [provider]    Physical Exam: Vitals:   05/10/18 1247 05/10/18 1300 05/10/18 1400 05/10/18 1430  BP: (!) 157/49 (!) 165/53 (!) 161/47 (!) 166/68  Pulse: 60  64   Resp: 14 16 12 18   Temp: 98.6 F (37 C)     TempSrc: Rectal     SpO2: 97%  98%   Weight: 70.3 kg     Height: 5\' 3"  (1.6 m)         Constitutional: Somnolent but easily arousable.  Grossly moving all extremities Eyes: PERRL, lids and conjunctivae normal ENMT: Mucous membranes are dry. Posterior pharynx clear of any exudate or lesions.Normal dentition.  Neck: normal, supple, no masses, no thyromegaly Respiratory: Breath sounds at the bases Cardiovascular: Regular rate and rhythm, no murmurs / rubs / gallops. No extremity edema. 2+ pedal pulses. No carotid bruits.  Abdomen: no tenderness, no masses palpated. No hepatosplenomegaly. Bowel sounds positive.  Musculoskeletal: no clubbing / cyanosis. No joint deformity upper and lower extremities.  Skin: no rashes, lesions, ulcers. No induration Neurologic: Called to assess but grossly moving all of her extremities Psychiatric: Difficult to assess    Labs on Admission: I have personally reviewed following labs and imaging studies  CBC: Recent Labs  Lab 05/10/18 1300  WBC 8.9  HGB 11.2*  HCT 33.5*  MCV 91.5  PLT 903*   Basic Metabolic Panel: Recent Labs  Lab 05/10/18 1300  NA 141  K 3.7  CL 102  CO2 28  GLUCOSE 384*  BUN 41*  CREATININE 1.46*  CALCIUM 8.3*   GFR: Estimated Creatinine Clearance: 22.7 mL/min (A) (by C-G formula based on SCr of 1.46 mg/dL (H)). Liver Function Tests: Recent Labs  Lab 05/10/18 1300  AST 16  ALT 14  ALKPHOS 57  BILITOT 0.4  PROT 6.1*  ALBUMIN 2.9*   No results for input(s): LIPASE, AMYLASE in the last 168 hours. No results for input(s): AMMONIA in the last 168 hours. Coagulation Profile: No results for input(s): INR, PROTIME in the  last 168 hours. Cardiac Enzymes: No results for input(s): CKTOTAL, CKMB, CKMBINDEX, TROPONINI in the last 168 hours. BNP (last 3 results) Recent Labs    09/02/17 1057 09/20/17 1332 10/07/17 1517  PROBNP 2,069* 2,802* 2,885*   HbA1C: No results for input(s): HGBA1C in the last 72 hours. CBG: Recent Labs  Lab 05/10/18 1245 05/10/18 1445  GLUCAP 354* 286*   Lipid Profile: No results for input(s): CHOL, HDL, LDLCALC, TRIG, CHOLHDL, LDLDIRECT in the last 72 hours. Thyroid Function Tests: No results for input(s): TSH, T4TOTAL, FREET4, T3FREE, THYROIDAB in the last 72 hours. Anemia Panel: No results for input(s): VITAMINB12, FOLATE, FERRITIN, TIBC, IRON, RETICCTPCT in the last 72 hours. Urine analysis:    Component Value Date/Time   COLORURINE YELLOW 05/10/2018 1346   APPEARANCEUR HAZY (A) 05/10/2018 1346   LABSPEC 1.006 05/10/2018 1346   PHURINE 6.0 05/10/2018 1346  GLUCOSEU >=500 (A) 05/10/2018 1346   HGBUR NEGATIVE 05/10/2018 1346   BILIRUBINUR NEGATIVE 05/10/2018 1346   KETONESUR NEGATIVE 05/10/2018 1346   PROTEINUR NEGATIVE 05/10/2018 1346   UROBILINOGEN 0.2 02/25/2011 1252   NITRITE POSITIVE (A) 05/10/2018 1346   LEUKOCYTESUR LARGE (A) 05/10/2018 1346   Sepsis Labs: !!!!!!!!!!!!!!!!!!!!!!!!!!!!!!!!!!!!!!!!!!!! @LABRCNTIP (procalcitonin:4,lacticidven:4) )No results found for this or any previous visit (from the past 240 hour(s)).   Radiological Exams on Admission: Ct Head Wo Contrast  Result Date: 05/10/2018 CLINICAL DATA:  84 year old female with altered mental status, nonverbal. EXAM: CT HEAD WITHOUT CONTRAST TECHNIQUE: Contiguous axial images were obtained from the base of the skull through the vertex without intravenous contrast. COMPARISON:  Head CT, brain MRI and intracranial MRA 04/21/2018. FINDINGS: Brain: The small lacunar infarct at the ventral right thalamus seen on the MRI last month remains occult by CT. Gray-white matter differentiation appears stable with  no midline shift, ventriculomegaly, mass effect, evidence of mass lesion, intracranial hemorrhage or evidence of cortically based acute infarction. Vascular: Calcified atherosclerosis at the skull base. No suspicious intracranial vascular hyperdensity. Skull: No acute osseous abnormality identified. Sinuses/Orbits: Stable left maxillary sinus disease, well pneumatized otherwise. Other: Stable orbit and scalp soft tissues. IMPRESSION: 1. No acute intracranial abnormality identified, stable non contrast CT appearance of the brain. 2. Small right thalamic lacune seen on the 04/21/2018 MRI remains occult by CT. Electronically Signed   By: Genevie Ann M.D.   On: 05/10/2018 13:30   Dg Chest Port 1 View  Result Date: 05/10/2018 CLINICAL DATA:  Weakness EXAM: PORTABLE CHEST 1 VIEW COMPARISON:  04/21/2018 FINDINGS: Mild cardiomegaly. Low lung volumes. No confluent airspace opacities, effusions or edema. No acute bony abnormality. IMPRESSION: Cardiomegaly.  Low lung volumes.  No active disease. Electronically Signed   By: Rolm Baptise M.D.   On: 05/10/2018 13:11     All images have been reviewed by me personally.    Assessment/Plan Active Problems:   UTI (urinary tract infection)   Hypertension, secondary   Type II diabetes mellitus (HCC)   Chronic systolic CHF (congestive heart failure) (HCC)   Hyperlipidemia   CKD (chronic kidney disease) stage 3, GFR 30-59 ml/min (HCC)   Cerebrovascular accident (CVA) due to thrombosis of right posterior cerebral artery ([place])   Acute metabolic encephalopathy    Acute metabolic encephalopathy, multifactorial Moderate to severe dehydration Urinary tract infection I suspect her dehydration is secondary to poor oral intake and her being on Lasix which led to urinary tract infection especially in the setting of diabetes causing metabolic encephalopathy. -Admit the patient to [place] floor.  Will closely monitor her.  CT of the head is negative for acute pathology.  Currently lethargic but moving all extremities (at baseline per family AAOX1-2) -Cultures have been ordered.  Previous urine culture reviewed, will start patient on IV Rocephin -Start patient on normal saline at 75 cc/h, monitor urine output. -Holding off on oral medications until her mentation improves.  Neurochecks. - Provide supportive care.  Hyperglycemia without anion gap Diabetes mellitus type 2 - We will gently hydrate the patient, start insulin sliding scale and Accu-Chek. -Hemoglobin A1c 04/22/2018 was 8.3.  Chronic systolic congestive heart failure ejection fraction 35-40% -Patient appears to be dehydrated at this time therefore started on gentle hydration.  Holding off on oral Lasix.  When her mentation improves we will also resume her home cardiac medications including metoprolol and Cozaar  CVA with left-sided facial droop, resolved -3 weeks ago.  Plan was to keep her on aspirin  and Plavix for 3 weeks and then Plavix alone along with high statin therapy.  She is approaching her 3-week mark therefore likely can discontinue aspirin prior to her discharge.  Chronic kidney disease stage III - Creatinine slightly elevated from baseline of 1.27 to 1.46 in the setting of dehydration.  Will monitor this.  Essential hypertension -Resume her home oral medications when able in the meantime IV hydralazine as needed.  History of dementia -Provide supportive care.  DVT prophylaxis: Lovenox Code Status: DNR/DNI Family Communication: Both the granddaughters at bedside Disposition Plan: To be determined.  Will likely end up staying in the hospital for at least 48 hours Consults called: None Admission status: MedSurg admission, inpatient given her mentation   Time Spent: 65 minutes.  >50% of the time was devoted to discussing the patients care, assessment, plan and disposition with other care givers along with counseling the patient about the risks and benefits of treatment.    Ankit  Arsenio Loader MD [facility] Pager [phone]  If 7PM-7AM, please contact night-coverage [email or web address] Password [email or web address]  05/10/2018, 3:14 PM

## 2018-05-10 NOTE — ED Triage Notes (Signed)
Staff at [facility] and her granddaughter (who is with her) report pt. To be "slowly getting weaker and weaker". Her granddaughter also tell us she is concerned d/t "she lays in her own urine a lot--I wonder if she has an infection". She also expresses concern d/t the medication regimen "needs to be reviewed". Pt. Arrives very drowsy and in no distress. Her skin is normal, warm and dry and she is breathing normally.

## 2018-05-10 NOTE — ED Notes (Signed)
ED TO INPATIENT HANDOFF REPORT  Name/Age/Gender Christine [name] 84 y.o. female  Code Status    Code Status Orders  (From admission, onward)         Start     Ordered   05/10/18 1447  Do not attempt resuscitation (DNR)  Continuous    Question Answer Comment  In the event of cardiac or respiratory ARREST Do not call a "code blue"   In the event of cardiac or respiratory ARREST Do not perform Intubation, CPR, defibrillation or ACLS   In the event of cardiac or respiratory ARREST Use medication by any route, position, wound care, and other measures to relive pain and suffering. May use oxygen, suction and manual treatment of airway obstruction as needed for comfort.      05/10/18 1448        Code Status History    Date Active Date Inactive Code Status Order ID Comments User Context   04/22/2018 1141 04/23/2018 1738 DNR [id number]  Jonetta Osgood, MD Inpatient   04/21/2018 2248 04/22/2018 1141 Full Code [id number]  Jani Gravel, MD ED   03/28/2016 0504 03/30/2016 1946 Full Code [id number]  Vianne Bulls, MD Inpatient      Home/SNF/Other Nursing Home  Chief Complaint lethargy  Level of Care/Admitting Diagnosis ED Disposition    ED Disposition Condition [facility] Area: [facility] [[id number]]  Level of Care: Med-Surg [16]  Diagnosis: Acute metabolic encephalopathy [[id number]]  Admitting Physician: Gerlean Ren [facility] [[id number]]  Attending Physician: Gerlean Ren [facility] [[id number]]  Estimated length of stay: past midnight tomorrow  Certification:: I certify this patient will need inpatient services for at least 2 midnights  PT Class (Do Not Modify): Inpatient [101]  PT Acc Code (Do Not Modify): Private [1]       Medical History Past Medical History:  Diagnosis Date  . Arthritis   . Chronic systolic CHF (congestive heart failure) ([place]) 05/17/2017   Echo 8/18 - EF 20-25, severe diffuse HK with anteroseptal, anterior, apical AK, Doppler parameters  c/w restrictive physio, no evidence of thrombus, mild to mod MR, mild LAE, PASP 33, trivial effusion, L pleural effusion  . Dementia ([place])   . Dilated cardiomyopathy ([place]) 05/17/2017   probably ischemic; presumed CAD given wall motion abnl on Echo 8/18  . Stroke [facility])     Allergies No Known Allergies  IV Location/Drains/Wounds Patient Lines/Drains/Airways Status   Active Line/Drains/Airways    Name:   Placement date:   Placement time:   Site:   Days:   Peripheral IV 05/10/18 Left Antecubital   05/10/18    1220    Antecubital   less than 1          Labs/Imaging Results for orders placed or performed during the hospital encounter of 05/10/18 (from the past 48 hour(s))  CBG monitoring, ED     Status: Abnormal   Collection Time: 05/10/18 12:45 PM  Result Value Ref Range   Glucose-Capillary 354 (H) 70 - 99 mg/dL  CBC     Status: Abnormal   Collection Time: 05/10/18  1:00 PM  Result Value Ref Range   WBC 8.9 4.0 - 10.5 K/uL   RBC 3.66 (L) 3.87 - 5.11 MIL/uL   Hemoglobin 11.2 (L) 12.0 - 15.0 g/dL   HCT 33.5 (L) 36.0 - 46.0 %   MCV 91.5 78.0 - 100.0 fL   MCH 30.6 26.0 - 34.0 pg   MCHC 33.4 30.0 - 36.0 g/dL  RDW 13.8 11.5 - 15.5 %   Platelets 510 (H) 150 - 400 K/uL    Comment: Performed at [facility], [place] [address], [place], [place] [address]  Comprehensive metabolic panel     Status: Abnormal   Collection Time: 05/10/18  1:00 PM  Result Value Ref Range   Sodium 141 135 - 145 mmol/L   Potassium 3.7 3.5 - 5.1 mmol/L   Chloride 102 98 - 111 mmol/L   CO2 28 22 - 32 mmol/L   Glucose, Bld 384 (H) 70 - 99 mg/dL   BUN 41 (H) 8 - 23 mg/dL   Creatinine, Ser 1.46 (H) 0.44 - 1.00 mg/dL   Calcium 8.3 (L) 8.9 - 10.3 mg/dL   Total Protein 6.1 (L) 6.5 - 8.1 g/dL   Albumin 2.9 (L) 3.5 - 5.0 g/dL   AST 16 15 - 41 U/L   ALT 14 0 - 44 U/L   Alkaline Phosphatase 57 38 - 126 U/L   Total Bilirubin 0.4 0.3 - 1.2 mg/dL   GFR calc non Af Amer 30 (L) >60 mL/min   GFR calc  Af Amer 35 (L) >60 mL/min    Comment: (NOTE) The eGFR has been calculated using the CKD EPI equation. This calculation has not been validated in all clinical situations. eGFR's persistently <60 mL/min signify possible Chronic Kidney Disease.    Anion gap 11 5 - 15    Comment: Performed at [facility], [place] [address] [place], [place] [address]  Urinalysis, Routine w reflex microscopic     Status: Abnormal   Collection Time: 05/10/18  1:46 PM  Result Value Ref Range   Color, Urine YELLOW YELLOW   APPearance HAZY (A) CLEAR   Specific Gravity, Urine 1.006 1.005 - 1.030   pH 6.0 5.0 - 8.0   Glucose, UA >=500 (A) NEGATIVE mg/dL   Hgb urine dipstick NEGATIVE NEGATIVE   Bilirubin Urine NEGATIVE NEGATIVE   Ketones, ur NEGATIVE NEGATIVE mg/dL   Protein, ur NEGATIVE NEGATIVE mg/dL   Nitrite POSITIVE (A) NEGATIVE   Leukocytes, UA LARGE (A) NEGATIVE   RBC / HPF 0-5 0 - 5 RBC/hpf   WBC, UA >50 (H) 0 - 5 WBC/hpf   Bacteria, UA RARE (A) NONE SEEN   Mucus PRESENT    Hyaline Casts, UA PRESENT     Comment: Performed at [facility], [place] [address], [place], [place] [address]  POC CBG, ED     Status: Abnormal   Collection Time: 05/10/18  2:45 PM  Result Value Ref Range   Glucose-Capillary 286 (H) 70 - 99 mg/dL   Ct Head Wo Contrast  Result Date: 05/10/2018 CLINICAL DATA:  84 year old female with altered mental status, nonverbal. EXAM: CT HEAD WITHOUT CONTRAST TECHNIQUE: Contiguous axial images were obtained from the base of the skull through the vertex without intravenous contrast. COMPARISON:  Head CT, brain MRI and intracranial MRA 04/21/2018. FINDINGS: Brain: The small lacunar infarct at the ventral right thalamus seen on the MRI last month remains occult by CT. Gray-white matter differentiation appears stable with no midline shift, ventriculomegaly, mass effect, evidence of mass lesion, intracranial hemorrhage or evidence of cortically based acute  infarction. Vascular: Calcified atherosclerosis at the skull base. No suspicious intracranial vascular hyperdensity. Skull: No acute osseous abnormality identified. Sinuses/Orbits: Stable left maxillary sinus disease, well pneumatized otherwise. Other: Stable orbit and scalp soft tissues. IMPRESSION: 1. No acute intracranial abnormality identified, stable non contrast CT appearance of the brain. 2.  Small right thalamic lacune seen on the 04/21/2018 MRI remains occult by CT. Electronically Signed   By: Genevie Ann M.D.   On: 05/10/2018 13:30   Dg Chest Port 1 View  Result Date: 05/10/2018 CLINICAL DATA:  Weakness EXAM: PORTABLE CHEST 1 VIEW COMPARISON:  04/21/2018 FINDINGS: Mild cardiomegaly. Low lung volumes. No confluent airspace opacities, effusions or edema. No acute bony abnormality. IMPRESSION: Cardiomegaly.  Low lung volumes.  No active disease. Electronically Signed   By: Rolm Baptise M.D.   On: 05/10/2018 13:11    Pending Labs Unresulted Labs (From admission, onward)    Start     Ordered   05/11/18 0500  Comprehensive metabolic panel  Tomorrow morning,   R     05/10/18 1448   05/11/18 0500  CBC  Tomorrow morning,   R     05/10/18 1448   05/11/18 0500  Magnesium  Tomorrow morning,   R     05/10/18 1448   05/10/18 1425  Urine Culture  Once,   STAT     05/10/18 1424          Vitals/Pain Today's Vitals   05/10/18 1247 05/10/18 1300 05/10/18 1400 05/10/18 1430  BP: (!) 157/49 (!) 165/53 (!) 161/47 (!) 166/68  Pulse: 60  64   Resp: _0 Temp: 98.6 F (37 C)     TempSrc: Rectal     SpO2: 97%  98%   Weight: 70.3 kg     Height: _1  (1.6 m)       Isolation Precautions No active isolations  Medications Medications  heparin injection 5,000 Units (has no administration in time range)  0.9 %  sodium chloride infusion (has no administration in time range)  acetaminophen (TYLENOL) tablet 650 mg (has no administration in time range)    Or  acetaminophen (TYLENOL)  suppository 650 mg (has no administration in time range)  HYDROcodone-acetaminophen (NORCO/VICODIN) 5-325 MG per tablet 1-2 tablet (has no administration in time range)  senna-docusate (Senokot-S) tablet 1 tablet (has no administration in time range)  bisacodyl (DULCOLAX) EC tablet 5 mg (has no administration in time range)  ondansetron (ZOFRAN) tablet 4 mg (has no administration in time range)    Or  ondansetron (ZOFRAN) injection 4 mg (has no administration in time range)  insulin aspart (novoLOG) injection 0-15 Units (has no administration in time range)  cefTRIAXone (ROCEPHIN) 1 g in sodium chloride 0.9 % 100 mL IVPB (has no administration in time range)  hydrALAZINE (APRESOLINE) injection 10 mg (has no administration in time range)  sodium chloride 0.9 % bolus 1,000 mL (0 mLs Intravenous Stopped 05/10/18 1402)  insulin aspart (novoLOG) injection 10 Units (10 Units Subcutaneous Given 05/10/18 1403)  cefTRIAXone (ROCEPHIN) 1 g in sodium chloride 0.9 % 100 mL IVPB (0 g Intravenous Stopped 05/10/18 1519)    Mobility walks with device

## 2018-05-10 NOTE — ED Notes (Signed)
I have just called report to [facility], Therapist, sports and will transport shortly.

## 2018-05-10 NOTE — ED Notes (Signed)
Bed: UX32 Expected date:  Expected time:  Means of arrival:  Comments: EMS- elderly, UTI/lethargic

## 2018-05-11 DIAGNOSIS — G9341 Metabolic encephalopathy: Secondary | ICD-10-CM | POA: Diagnosis not present

## 2018-05-11 DIAGNOSIS — I63331 Cerebral infarction due to thrombosis of right posterior cerebral artery: Secondary | ICD-10-CM | POA: Diagnosis not present

## 2018-05-11 DIAGNOSIS — I5022 Chronic systolic (congestive) heart failure: Secondary | ICD-10-CM | POA: Diagnosis not present

## 2018-05-11 DIAGNOSIS — N183 Chronic kidney disease, stage 3 (moderate): Secondary | ICD-10-CM | POA: Diagnosis not present

## 2018-05-11 DIAGNOSIS — E1122 Type 2 diabetes mellitus with diabetic chronic kidney disease: Secondary | ICD-10-CM

## 2018-05-11 LAB — CBC
HEMATOCRIT: 37.6 % (ref 36.0–46.0)
HEMOGLOBIN: 12.9 g/dL (ref 12.0–15.0)
MCH: 31.1 pg (ref 26.0–34.0)
MCHC: 34.3 g/dL (ref 30.0–36.0)
MCV: 90.6 fL (ref 78.0–100.0)
Platelets: 552 10*3/uL — ABNORMAL HIGH (ref 150–400)
RBC: 4.15 MIL/uL (ref 3.87–5.11)
RDW: 13.7 % (ref 11.5–15.5)
WBC: 8.4 10*3/uL (ref 4.0–10.5)

## 2018-05-11 LAB — COMPREHENSIVE METABOLIC PANEL
ALBUMIN: 3.2 g/dL — AB (ref 3.5–5.0)
ALK PHOS: 67 U/L (ref 38–126)
ALT: 17 U/L (ref 0–44)
ANION GAP: 11 (ref 5–15)
AST: 19 U/L (ref 15–41)
BILIRUBIN TOTAL: 0.7 mg/dL (ref 0.3–1.2)
BUN: 27 mg/dL — AB (ref 8–23)
CALCIUM: 9.1 mg/dL (ref 8.9–10.3)
CO2: 28 mmol/L (ref 22–32)
Chloride: 108 mmol/L (ref 98–111)
Creatinine, Ser: 1.09 mg/dL — ABNORMAL HIGH (ref 0.44–1.00)
GFR calc Af Amer: 49 mL/min — ABNORMAL LOW (ref >60)
GFR calc non Af Amer: 42 mL/min — ABNORMAL LOW (ref >60)
Glucose, Bld: 249 mg/dL — ABNORMAL HIGH (ref 70–99)
Potassium: 3.5 mmol/L (ref 3.5–5.1)
SODIUM: 147 mmol/L — AB (ref 135–145)
TOTAL PROTEIN: 6.7 g/dL (ref 6.5–8.1)

## 2018-05-11 LAB — GLUCOSE, CAPILLARY
GLUCOSE-CAPILLARY: 236 mg/dL — AB (ref 70–99)
GLUCOSE-CAPILLARY: 172 mg/dL — AB (ref 70–99)
GLUCOSE-CAPILLARY: 253 mg/dL — AB (ref 70–99)
Glucose-Capillary: 129 mg/dL — ABNORMAL HIGH (ref 70–99)
Glucose-Capillary: 176 mg/dL — ABNORMAL HIGH (ref 70–99)
Glucose-Capillary: 167 mg/dL — ABNORMAL HIGH (ref 70–99)

## 2018-05-11 LAB — MAGNESIUM: MAGNESIUM: 1.9 mg/dL (ref 1.7–2.4)

## 2018-05-11 MED ORDER — CLOPIDOGREL BISULFATE 75 MG PO TABS
75.0000 mg | ORAL_TABLET | Freq: Every day | ORAL | Status: AC
  Administered 2018-05-11: 75 mg via ORAL
  Filled 2018-05-11 (×2): qty 1

## 2018-05-11 MED ORDER — RESOURCE THICKENUP CLEAR PO POWD
ORAL | Status: DC | PRN
  Filled 2018-05-11: qty 125

## 2018-05-11 MED ORDER — ASPIRIN EC 81 MG PO TBEC
81.0000 mg | DELAYED_RELEASE_TABLET | Freq: Every day | ORAL | Status: DC
  Administered 2018-05-11 – 2018-05-16 (×4): 81 mg via ORAL
  Filled 2018-05-11 (×5): qty 1

## 2018-05-11 MED ORDER — ATORVASTATIN CALCIUM 40 MG PO TABS
80.0000 mg | ORAL_TABLET | Freq: Every day | ORAL | Status: DC
  Administered 2018-05-11 – 2018-05-15 (×3): 80 mg via ORAL
  Filled 2018-05-11 (×4): qty 2

## 2018-05-11 NOTE — Progress Notes (Signed)
PROGRESS NOTE    Christine [name]  NWG:[id number] DOB: 1922-07-27 DOA: 05/10/2018 PCP: Lajean Manes, MD   Brief Narrative: Christine INTRIERI is a 84 y.o. female with medical history significant of with history of systolic congestive heart failure ejection fraction 35%, dementia, diabetes mellitus type 2, essential hypertension, recent CVA, CKD stage III. She presented secondary to mental status change and found to be dehydrated in addition to having a UTI.   Assessment & Plan:   Active Problems:   UTI (urinary tract infection)   Hypertension, secondary   Type II diabetes mellitus (HCC)   Chronic systolic CHF (congestive heart failure) (HCC)   Hyperlipidemia   CKD (chronic kidney disease) stage 3, GFR 30-59 ml/min (HCC)   Cerebrovascular accident (CVA) due to thrombosis of right posterior cerebral artery ([place])   Acute metabolic encephalopathy   Acute metabolic encephalopathy In setting of acute infection. Urinary source. Improved back to baseline  Urinary tract infection Urine cultures significant for E. Coli. Sensitivities pending -Urine culture -Continue ceftriaxone  Hypernatremia In setting of IV NS fluids -Discontinue NS fluid  Diabetes mellitus, type 2 On glimepiride and Januvia as an outpatient. -Continue SSI -Discontinue glimepiride on discharge  Chronic systolic heart failure Euvolemic. Initially dehydrated. Will need to monitor daily weights closely to ensure she does not get over-diuresed in the future.  History of CVA On aspirin and plavix. Will discontinue plavix prior to discharge -Continue Aspirin and Plavix  CKD stage III Baseline of 1.27. Stable  Essential hypertension Holding home losartan-hydrochlorothiazide. Will likely continue losartan only as an outpatient.  Dementia Supportive care -SLP evaluation -Palliative consult   DVT prophylaxis: Lovenox Code Status:   Code Status: DNR Family Communication: Granddaughter at  bedside Disposition Plan: Discharge to SNF pending results of urine culture   Consultants:   None  Procedures:   None  Antimicrobials:  Ceftriaxone    Subjective: No issues overnight.  Objective: Vitals:   05/10/18 2024 05/11/18 0524 05/11/18 0648 05/11/18 1427  BP: (!) 160/47 (!) 156/58  (!) 179/80  Pulse: 62 70  77  Resp: (!) 21 15  19   Temp: 98.7 F (37.1 C) 98.5 F (36.9 C)  98.7 F (37.1 C)  TempSrc: Oral Oral  Oral  SpO2: 97% 95%  99%  Weight:   73.1 kg   Height:        Intake/Output Summary (Last 24 hours) at 05/11/2018 1723 Last data filed at 05/11/2018 1600 Gross per 24 hour  Intake 572.8 ml  Output 925 ml  Net -352.2 ml   Filed Weights   05/10/18 1247 05/11/18 0648  Weight: 70.3 kg 73.1 kg    Examination:  General exam: Appears calm and comfortable Respiratory system: Clear to auscultation. Respiratory effort normal. Cardiovascular system: S1 & S2 heard, RRR. No murmurs, rubs, gallops or clicks. Gastrointestinal system: Abdomen is nondistended, soft and nontender. No organomegaly or masses felt. Normal bowel sounds heard. Central nervous system: Alert and oriented to person only. Extremities: No edema. No calf tenderness Skin: No cyanosis. No rashes Psychiatry: Judgement and insight appear impaired. Mood & affect appropriate.     Data Reviewed: I have personally reviewed following labs and imaging studies  CBC: Recent Labs  Lab 05/10/18 1300 05/11/18 0927  WBC 8.9 8.4  HGB 11.2* 12.9  HCT 33.5* 37.6  MCV 91.5 90.6  PLT 510* 578*   Basic Metabolic Panel: Recent Labs  Lab 05/10/18 1300 05/11/18 0927  NA 141 147*  K 3.7 3.5  CL 102  108  CO2 28 28  GLUCOSE 384* 249*  BUN 41* 27*  CREATININE 1.46* 1.09*  CALCIUM 8.3* 9.1  MG  --  1.9   GFR: Estimated Creatinine Clearance: 30.9 mL/min (A) (by C-G formula based on SCr of 1.09 mg/dL (H)). Liver Function Tests: Recent Labs  Lab 05/10/18 1300 05/11/18 0927  AST 16 19  ALT  14 17  ALKPHOS 57 67  BILITOT 0.4 0.7  PROT 6.1* 6.7  ALBUMIN 2.9* 3.2*   No results for input(s): LIPASE, AMYLASE in the last 168 hours. No results for input(s): AMMONIA in the last 168 hours. Coagulation Profile: No results for input(s): INR, PROTIME in the last 168 hours. Cardiac Enzymes: No results for input(s): CKTOTAL, CKMB, CKMBINDEX, TROPONINI in the last 168 hours. BNP (last 3 results) Recent Labs    09/02/17 1057 09/20/17 1332 10/07/17 1517  PROBNP 2,069* 2,802* 2,885*   HbA1C: No results for input(s): HGBA1C in the last 72 hours. CBG: Recent Labs  Lab 05/11/18 0235 05/11/18 0646 05/11/18 0734 05/11/18 1153 05/11/18 1651  GLUCAP 129* 176* 167* 236* 172*   Lipid Profile: No results for input(s): CHOL, HDL, LDLCALC, TRIG, CHOLHDL, LDLDIRECT in the last 72 hours. Thyroid Function Tests: No results for input(s): TSH, T4TOTAL, FREET4, T3FREE, THYROIDAB in the last 72 hours. Anemia Panel: No results for input(s): VITAMINB12, FOLATE, FERRITIN, TIBC, IRON, RETICCTPCT in the last 72 hours. Sepsis Labs: No results for input(s): PROCALCITON, LATICACIDVEN in the last 168 hours.  Recent Results (from the past 240 hour(s))  Urine Culture     Status: Abnormal (Preliminary result)   Collection Time: 05/10/18  2:25 PM  Result Value Ref Range Status   Specimen Description   Final    URINE, CLEAN CATCH Performed at [facility], [place] [address] [place], [place] [address]    Special Requests   Final    NONE Performed at [facility], [place] [address], [place], [place] [address]    Culture >=100,000 COLONIES/mL ESCHERICHIA COLI (A)  Final   Report Status PENDING  Incomplete         Radiology Studies: Ct Head Wo Contrast  Result Date: 05/10/2018 CLINICAL DATA:  84 year old female with altered mental status, nonverbal. EXAM: CT HEAD WITHOUT CONTRAST TECHNIQUE: Contiguous axial images were obtained from the base of the skull  through the vertex without intravenous contrast. COMPARISON:  Head CT, brain MRI and intracranial MRA 04/21/2018. FINDINGS: Brain: The small lacunar infarct at the ventral right thalamus seen on the MRI last month remains occult by CT. Gray-white matter differentiation appears stable with no midline shift, ventriculomegaly, mass effect, evidence of mass lesion, intracranial hemorrhage or evidence of cortically based acute infarction. Vascular: Calcified atherosclerosis at the skull base. No suspicious intracranial vascular hyperdensity. Skull: No acute osseous abnormality identified. Sinuses/Orbits: Stable left maxillary sinus disease, well pneumatized otherwise. Other: Stable orbit and scalp soft tissues. IMPRESSION: 1. No acute intracranial abnormality identified, stable non contrast CT appearance of the brain. 2. Small right thalamic lacune seen on the 04/21/2018 MRI remains occult by CT. Electronically Signed   By: Genevie Ann M.D.   On: 05/10/2018 13:30   Dg Chest Port 1 View  Result Date: 05/10/2018 CLINICAL DATA:  Weakness EXAM: PORTABLE CHEST 1 VIEW COMPARISON:  04/21/2018 FINDINGS: Mild cardiomegaly. Low lung volumes. No confluent airspace opacities, effusions or edema. No acute bony abnormality. IMPRESSION: Cardiomegaly.  Low lung volumes.  No active disease. Electronically Signed   By: Rolm Baptise M.D.  On: 05/10/2018 13:11        Scheduled Meds: . aspirin EC  81 mg Oral Daily  . atorvastatin  80 mg Oral q1800  . clopidogrel  75 mg Oral Daily  . heparin  5,000 Units Subcutaneous Q8H  . insulin aspart  0-15 Units Subcutaneous TID WC   Continuous Infusions: . cefTRIAXone (ROCEPHIN)  IV 1 g (05/11/18 1519)     LOS: 1 day     Cordelia Poche, MD [facility] 05/11/2018, 5:23 PM Pager: [phone]  If 7PM-7AM, please contact night-coverage [email or web address] 05/11/2018, 5:23 PM

## 2018-05-11 NOTE — Evaluation (Signed)
Clinical/Bedside Swallow Evaluation Patient Details  Name: Christine [name] MRN: [id number] Date of Birth: October 01, 1921  Today's Date: 05/11/2018 Time: SLP Start Time (ACUTE ONLY): 1145 SLP Stop Time (ACUTE ONLY): 1215 SLP Time Calculation (min) (ACUTE ONLY): 30 min  Past Medical History:  Past Medical History:  Diagnosis Date  . Arthritis   . Chronic systolic CHF (congestive heart failure) ([place]) 05/17/2017   Echo 8/18 - EF 20-25, severe diffuse HK with anteroseptal, anterior, apical AK, Doppler parameters c/w restrictive physio, no evidence of thrombus, mild to mod MR, mild LAE, PASP 33, trivial effusion, L pleural effusion  . Dementia ([place])   . Dilated cardiomyopathy ([place]) 05/17/2017   probably ischemic; presumed CAD given wall motion abnl on Echo 8/18  . Stroke [facility])    Past Surgical History:  Past Surgical History:  Procedure Laterality Date  . ANTERIOR APPROACH HEMI HIP ARTHROPLASTY Left 03/28/2016   Procedure: ANTERIOR APPROACH HEMI HIP ARTHROPLASTY;  Surgeon: Dorna Leitz, MD;  Location: [facility];  Service: Orthopedics;  Laterality: Left;   HPI:  84 year old female admitted 05/10/18 with AMS.  PMH: sCHF, dementia, DM2, essential HTN, recent CVA, CKD3. Head CT = no acute abnormality, CXR = no active disease.    Assessment / Plan / Recommendation Clinical Impression  Pt known to this SLP, having completed BSE 2 weeks ago. Pt presents in similar fashion, with immediate cough response after thin liquids, and extended oral prep of solids. Pt appears to tolerate nectar thick liquids, puree, and soft solids without oral difficulty or overt s/s aspiration. Pt is quite confused, having visual hallucinations and would therefore benefit from 1:1 supervision during meals. She is able to self-feed. however, may require assistance with adherence to safe swallow precautions (posted at [facility]).   RN indicated family wanted SLP to allow pt to have specific items at her facility, referencing water. Pt is  able to have water, and any other liquid, AS LONG AS IT IS THICKENED TO A NECTAR CONSISTENCY. Pt CXR indicates no active disease. ST will follow briefly for assessment of diet tolerance and education. No family present at this time.    SLP Visit Diagnosis: Dysphagia, unspecified (R13.10)    Aspiration Risk  Mild aspiration risk    Diet Recommendation Dysphagia 2 (Fine chop);Nectar-thick liquid   Liquid Administration via: Straw;Cup Medication Administration: Whole meds with puree(1 at a time) Supervision: Patient able to self feed;Staff to assist with self feeding;Full supervision/cueing for compensatory strategies Compensations: Slow rate;Small sips/bites;Minimize environmental distractions Postural Changes: Seated upright at 90 degrees;Remain upright for at least 30 minutes after po intake    Other  Recommendations Oral Care Recommendations: Oral care BID(remove and clean dentures as well) Other Recommendations: Order thickener from pharmacy   Follow up Recommendations 24 hour supervision/assistance      Frequency and Duration min 2x/week  1 week;2 weeks       Prognosis Prognosis for Safe Diet Advancement: Fair Barriers to Reach Goals: Cognitive deficits      Swallow Study   General Date of Onset: 05/11/18 HPI: 84 year old female admitted 05/10/18 with AMS.  PMH: sCHF, dementia, DM2, essential HTN, recent CVA, CKD3. Head CT = no acute abnormality, CXR = no active disease.  Type of Study: Bedside Swallow Evaluation Previous Swallow Assessment: BSE 04/22/18 rec D2/NTL Diet Prior to this Study: Dysphagia 2 (chopped);Nectar-thick liquids Temperature Spikes Noted: No Respiratory Status: Room air History of Recent Intubation: No Behavior/Cognition: Alert;Cooperative;Pleasant mood;Confused;Distractible;Requires cueing(HOH) Oral Cavity Assessment: Within Functional Limits Oral  Cavity - Dentition: Dentures, top;Dentures, bottom Vision: Functional for self-feeding Self-Feeding  Abilities: Able to feed self;Needs assist;Needs set up Patient Positioning: Upright in bed Baseline Vocal Quality: Normal Volitional Cough: Strong Volitional Swallow: Able to elicit    Oral/Motor/Sensory Function Overall Oral Motor/Sensory Function: Within functional limits   Ice Chips Ice chips: Not tested   Thin Liquid Thin Liquid: Impaired Presentation: Straw Pharyngeal  Phase Impairments: Cough - Immediate;Suspected delayed Swallow    Nectar Thick Nectar Thick Liquid: Within functional limits Presentation: Straw;Cup;Self Fed   Honey Thick Honey Thick Liquid: Not tested   Puree Puree: Within functional limits Presentation: Spoon;Self Fed   Solid     Solid: Impaired Presentation: Self Fed Oral Phase Impairments: Impaired mastication Oral Phase Functional Implications: Prolonged oral transit Pharyngeal Phase Impairments: Suspected delayed Swallow     Celia [name], [facility], [place] Speech Language Pathologist [phone]  Shonna Chock 05/11/2018,12:23 PM

## 2018-05-11 NOTE — Progress Notes (Signed)
  Speech Language Pathology Treatment: Dysphagia  Patient Details Name: Christine [name] MRN: [id number] DOB: Jul 10, 1922 Today's Date: 05/11/2018 Time: 1517-6160 SLP Time Calculation (min) (ACUTE ONLY): 20 min  Assessment / Plan / Recommendation Clinical Impression  RN called SLP to inform that pt's granddaughter [facility]) was present, and requesting to speak with me. I met with pt's granddaughter and relayed information regarding today's swallow evaluation and subsequent recommendations. This includes Dys 2 solids for energy conservation, and nectar thick liquids to minimize aspiration risk. Melodi verbalized a desire for pt to "have whatever she wants to eat", but is agreeable to continuing nectar thick liquids.  Regular diet will allow full range of po choices, where [id number] diet is significantly restrictive. Pt would benefit from having solids cut up for her, and that her confusion could increase aspiration and/or choking risk if this is not done. Melodi verbalized awareness. SLP will follow for diet tolerance and education over the next few days.   Melodi also requested consultation with Palliative Care so that she and her family could proceed appropriately with EOL decisions and plans, as pt "has no quality of life like this".  RN and MD were informed of Melodi's appropriate requests.     HPI HPI: 84 year old female admitted 05/10/18 with AMS.  PMH: sCHF, dementia, DM2, essential HTN, recent CVA, CKD3. Head CT = no acute abnormality, CXR = no active disease.       SLP Plan  Continue with current plan of care       Recommendations  Diet recommendations: Regular;Nectar-thick liquid Liquids provided via: Cup;Straw Medication Administration: Whole meds with puree Supervision: Patient able to self feed;Staff to assist with self feeding;Full supervision/cueing for compensatory strategies Compensations: Slow rate;Small sips/bites;Minimize environmental distractions                Oral Care Recommendations: Oral care BID Follow up Recommendations: 24 hour supervision/assistance SLP Visit Diagnosis: Dysphagia, unspecified (R13.10) Plan: Continue with current plan of care       GO               Celia [name] [facility], CCC-SLP Speech Language Pathologist [phone]  Shonna Chock 05/11/2018, 3:16 PM

## 2018-05-12 DIAGNOSIS — Z515 Encounter for palliative care: Secondary | ICD-10-CM

## 2018-05-12 DIAGNOSIS — G9341 Metabolic encephalopathy: Secondary | ICD-10-CM | POA: Diagnosis not present

## 2018-05-12 DIAGNOSIS — N183 Chronic kidney disease, stage 3 (moderate): Secondary | ICD-10-CM | POA: Diagnosis not present

## 2018-05-12 DIAGNOSIS — E785 Hyperlipidemia, unspecified: Secondary | ICD-10-CM | POA: Diagnosis not present

## 2018-05-12 DIAGNOSIS — R531 Weakness: Secondary | ICD-10-CM

## 2018-05-12 DIAGNOSIS — R4182 Altered mental status, unspecified: Secondary | ICD-10-CM

## 2018-05-12 DIAGNOSIS — I5022 Chronic systolic (congestive) heart failure: Secondary | ICD-10-CM | POA: Diagnosis not present

## 2018-05-12 DIAGNOSIS — Z7189 Other specified counseling: Secondary | ICD-10-CM

## 2018-05-12 DIAGNOSIS — N3001 Acute cystitis with hematuria: Secondary | ICD-10-CM

## 2018-05-12 LAB — GLUCOSE, CAPILLARY
GLUCOSE-CAPILLARY: 241 mg/dL — AB (ref 70–99)
GLUCOSE-CAPILLARY: 194 mg/dL — AB (ref 70–99)
GLUCOSE-CAPILLARY: 203 mg/dL — AB (ref 70–99)
Glucose-Capillary: 253 mg/dL — ABNORMAL HIGH (ref 70–99)
Glucose-Capillary: 269 mg/dL — ABNORMAL HIGH (ref 70–99)

## 2018-05-12 LAB — URINE CULTURE

## 2018-05-12 MED ORDER — KETOROLAC TROMETHAMINE 15 MG/ML IJ SOLN
15.0000 mg | Freq: Once | INTRAMUSCULAR | Status: AC
  Administered 2018-05-14: 15 mg via INTRAVENOUS
  Filled 2018-05-12: qty 1

## 2018-05-12 NOTE — Progress Notes (Signed)
CSW notified that pt was determined to be ineligible for [facility] admission.  Discussed with pt's son. States interested in [facility] made referral.  Sharren Bridge, MSW, [place] [facility] 05/12/2018 [phone]

## 2018-05-12 NOTE — Care Management Obs Status (Signed)
[place] NOTIFICATION   Patient Details  Name: Christine [name] MRN: [id number] Date of Birth: [birth year]   Medicare Observation Status Notification Given:       Leeroy Cha, RN 05/12/2018, 10:45 AM

## 2018-05-12 NOTE — Consult Note (Signed)
Consultation Note Date: 05/12/2018   Patient Name: Christine [name]  DOB: 01/15/1922  MRN: [id number]  Age / Sex: 84 y.o., female  PCP: Lajean Manes, MD Referring Physician: Mariel Aloe, MD  Reason for Consultation: Establishing goals of care  HPI/Patient Profile: 84 y.o. female admitted on 05/10/2018    Clinical Assessment and Goals of Care:  84 year old lady with systolic heart failure ejection fraction 35%, stage III chronic kidney disease, history of having had dementia for several years now, underlying diabetes hypertension. Patient suffered a stroke in September 2019 and was sent to skilled nursing facility for rehabilitation attempt. The patient has been admitted to hospital medicine service with altered mental status, urinary tract infection, dehydration, hypernatremia.  The patient is with generalized weakness, nonambulatory, with ongoing decline cognitively as well as functionally from dementia standpoint. This has been exacerbated since her several vascular accident last month.  In this hospitalization, patient has been evaluated by speech language pathology. Patient is noted to have high aspiration risk however family elected for comfort feeds.  A palliative consultation has been requested for goals of care discussions.  Patient is resting in bed. She does not open her eyes. She appears comfortable. She took a few bites of watermelon but had coughing/choking.  I introduced myself and palliative care as follows: Palliative medicine is specialized medical care for people living with serious illness. It focuses on providing relief from the symptoms and stress of a serious illness. The goal is to improve quality of life for both the patient and the family.  Goals, wishes and values discussed. Granddaughter was present at the bedside. I discussed with her in detail. Subsequently, call placed and  is also discussed with son. Brief life review performed. Patient is a Psychologist, sport and exercise. She has worked hard all her life. She had an accident as a child and a lawnmower ran over her right foot, she has a few toes missing.patient is described as an independent, hard-working person who did not like doctors and hospitals.  Granddaughter is the primary historian who states that the patient simply has not been able to participate in the rehabilitation attempt. She has ongoing decline. Patient is largely nonverbal. She is nonambulatory. Granddaughter states that the patient was having visual and auditory hallucinations and was declining rapidly.  We discussed about comfort measures. We discussed about comfort feeds. We discussed about hospice philosophy of care. Family does not want the patient to go back to former facility, they're familiar with the type of care that can be provided in a residential hospice setting.   please note additional discussions and recommendations as listed below. Thank you for the consult.  HCPOA  son Tomeeka Plaugher [phone] is HCPOA agent, she has 2 sons, another son Myrna Blazer lives in [place], [place].  Additionally, her grand daughter Melodi [phone] is also her care giver.   SUMMARY OF RECOMMENDATIONS    DNR DNI Comfort feeds, family understands high risk of aspiration. Follow precautions for safe swallow, as much as possible.  Comfort focused care Consider residential hospice on discharge, will consult CSW to help facilitate.  Prognosis likely less than 2 weeks at this point, patient with recent stroke, ongoing progressive decline cognitively and functionally since then. Also has dementia.   Code Status/Advance Care Planning:  DNR    Symptom Management:    see above   Palliative Prophylaxis:   Delirium Protocol  Additional Recommendations (Limitations, Scope, Preferences):  Full Comfort Care  Psycho-social/Spiritual:   Desire for further Chaplaincy  support:yes  Additional Recommendations: Education on Hospice  Prognosis:   < 2 weeks  Discharge Planning: Hospice facility      Primary Diagnoses: Present on Admission: . Acute metabolic encephalopathy . UTI (urinary tract infection) . Hypertension, secondary . Chronic systolic CHF (congestive heart failure) ([place]) . Hyperlipidemia . Cerebrovascular accident (CVA) due to thrombosis of right posterior cerebral artery ([place]) . CKD (chronic kidney disease) stage 3, GFR 30-59 ml/min (HCC)   I have reviewed the medical record, interviewed the patient and family, and examined the patient. The following aspects are pertinent.  Past Medical History:  Diagnosis Date  . Arthritis   . Chronic systolic CHF (congestive heart failure) ([place]) 05/17/2017   Echo 8/18 - EF 20-25, severe diffuse HK with anteroseptal, anterior, apical AK, Doppler parameters c/w restrictive physio, no evidence of thrombus, mild to mod MR, mild LAE, PASP 33, trivial effusion, L pleural effusion  . Dementia ([place])   . Dilated cardiomyopathy ([place]) 05/17/2017   probably ischemic; presumed CAD given wall motion abnl on Echo 8/18  . Stroke [facility])    Social History   Socioeconomic History  . Marital status: Widowed    Spouse name: Not on file  . Number of children: Not on file  . Years of education: Not on file  . Highest education level: Not on file  Occupational History  . Not on file  Social Needs  . Financial resource strain: Not on file  . Food insecurity:    Worry: Not on file    Inability: Not on file  . Transportation needs:    Medical: Not on file    Non-medical: Not on file  Tobacco Use  . Smoking status: Never Smoker  . Smokeless tobacco: Never Used  Substance and Sexual Activity  . Alcohol use: No  . Drug use: No  . Sexual activity: Never  Lifestyle  . Physical activity:    Days per week: Not on file    Minutes per session: Not on file  . Stress: Not on file  Relationships  . Social  connections:    Talks on phone: Not on file    Gets together: Not on file    Attends religious service: Not on file    Active member of club or organization: Not on file    Attends meetings of clubs or organizations: Not on file    Relationship status: Not on file  Other Topics Concern  . Not on file  Social History Narrative  . Not on file   Family History  Family history unknown: Yes   Scheduled Meds: . aspirin EC  81 mg Oral Daily  . atorvastatin  80 mg Oral q1800  . clopidogrel  75 mg Oral Daily  . heparin  5,000 Units Subcutaneous Q8H  . insulin aspart  0-15 Units Subcutaneous TID WC   Continuous Infusions: . cefTRIAXone (ROCEPHIN)  IV 1 g (05/11/18 1519)   PRN Meds:.acetaminophen **OR** acetaminophen, bisacodyl, hydrALAZINE, HYDROcodone-acetaminophen, ondansetron **OR** ondansetron (ZOFRAN) IV, RESOURCE THICKENUP  CLEAR, senna-docusate Medications Prior to Admission:  Prior to Admission medications   Medication Sig Start Date End Date Taking? Authorizing Provider  aspirin EC 81 MG tablet Take 81 mg by mouth daily.   Yes [provider]  atorvastatin (LIPITOR) 80 MG tablet Take 1 tablet (80 mg total) by mouth daily. 04/23/18 07/22/18 Yes Ghimire, Henreitta Leber, MD  clopidogrel (PLAVIX) 75 MG tablet Take 1 tablet (75 mg total) by mouth daily. 04/23/18  Yes Ghimire, Henreitta Leber, MD  furosemide (LASIX) 40 MG tablet Take 1 tablet (40 mg total) by mouth daily. 09/21/17  Yes Fay Records, MD  glimepiride (AMARYL) 2 MG tablet Take 1 tablet (2 mg total) by mouth daily with breakfast. 08/24/17  Yes Weaver, Scott T, PA-C  losartan-hydrochlorothiazide [facility]) 50-12.5 MG tablet Take one half tablet (25/6.25) daily 03/15/18  Yes Fay Records, MD  Melatonin 3 MG TABS Take 3 mg by mouth at bedtime.   Yes [provider]  metoprolol succinate (TOPROL XL) 25 MG 24 hr tablet Take 1 tablet (25 mg total) by mouth daily. 10/19/17  Yes Fay Records, MD  pantoprazole (PROTONIX) 40 MG  tablet Take 1 tablet (40 mg total) by mouth daily at 12 noon. 04/23/18  Yes Ghimire, Henreitta Leber, MD  senna-docusate (SENOKOT-S) 8.6-50 MG tablet Take 1 tablet by mouth at bedtime.   Yes [provider]  sitaGLIPtin (JANUVIA) 50 MG tablet Take 50 mg by mouth daily.   Yes [provider]   No Known Allergies Review of Systems Non verbal   Physical Exam  Appears calm and comfortable Resting in bed Asleep, arouses easily when touched Doesn't verbalize S 1 S 2 Abdomen is not distended Shallow clear anterior lung sounds Extremities have no edema, warm to touch. Has some toes missing on R foot, from a lawnmower accident, as a child.     Vital Signs: BP (!) 156/45   Pulse 78   Temp 98.1 F (36.7 C) (Oral)   Resp 19   Ht 5\' 3"  (1.6 m)   Wt 73 kg   SpO2 93%   BMI 28.51 kg/m  Pain Scale: PAINAD POSS *See Group Information*: 2-Acceptable,Slightly drowsy, easily aroused Pain Score: 0-No pain   SpO2: SpO2: 93 % O2 Device:SpO2: 93 % O2 Flow Rate: .   IO: Intake/output summary:   Intake/Output Summary (Last 24 hours) at 05/12/2018 1015 Last data filed at 05/12/2018 7169 Gross per 24 hour  Intake 0 ml  Output 1000 ml  Net -1000 ml    LBM: Last BM Date: 05/10/18 Baseline Weight: Weight: 70.3 kg Most recent weight: Weight: 73 kg     Palliative Assessment/Data:   PPS 30%  Time In:  9 Time Out:10.10   Time Total:  70 min  Greater than 50%  of this time was spent counseling and coordinating care related to the above assessment and plan.  Signed by: Loistine Chance, MD  [phone]  Please contact Palliative Medicine Team phone at [phone] for questions and concerns.  For individual provider: See Shea Evans

## 2018-05-12 NOTE — Progress Notes (Signed)
PT Cancellation Note  Patient Details Name: Christine [name] MRN: [id number] DOB: November 08, 1921   Cancelled Treatment:    Reason Eval/Treat Not Completed: Fatigue/lethargy limiting ability to participate;PT screened, no needs identified, will sign off - PT checked in with nursing prior to entering pt room, and RN states that hospice is being called in, pt having no mobility needs at this time. Pt also extremely lethargic. PT signing off at this time, please consult again for any changes in physical status.    Julien Girt, PT Acute Rehabilitation Services Pager [phone]  Office [phone]    [place] 05/12/2018, 1:27 PM

## 2018-05-12 NOTE — Progress Notes (Signed)
[facility] [place] [facility])    Received request from [place] for family interest in [facility].  Chart reviewed, and patient is ineligible for residential hospice at this time per [place].  CSW made aware.  Please call with any hospice related questions or concerns.  Thank you, Venia Carbon BSN, [place] [facility] Liaison (listed in [place]) [phone]

## 2018-05-12 NOTE — Care Management CC44 (Signed)
Condition Code 44 Documentation Completed  Patient Details  Name: Christine [name] MRN: [id number] Date of Birth: Apr 18, 1922   Condition Code 44 given:  Yes Patient signature on Condition Code 44 notice:  Yes Documentation of 2 MD's agreement:  Yes Code 44 added to claim:  Yes    Leeroy Cha, RN 05/12/2018, 10:45 AM

## 2018-05-12 NOTE — Progress Notes (Signed)
PROGRESS NOTE    Christine [name]  WCB:[id number] DOB: [birth year] DOA: 05/10/2018 PCP: Lajean Manes, MD   Brief Narrative: Christine [name] is a 84 y.o. female with medical history significant of with history of systolic congestive heart failure ejection fraction 35%, dementia, diabetes mellitus type 2, essential hypertension, recent CVA, CKD stage III. She presented secondary to mental status change and found to be dehydrated in addition to having a UTI.   Assessment & Plan:   Active Problems:   UTI (urinary tract infection)   Hypertension, secondary   Type II diabetes mellitus (HCC)   Chronic systolic CHF (congestive heart failure) (HCC)   Hyperlipidemia   CKD (chronic kidney disease) stage 3, GFR 30-59 ml/min (HCC)   Cerebrovascular accident (CVA) due to thrombosis of right posterior cerebral artery ([place])   Acute metabolic encephalopathy   Altered mental status   Generalized weakness   Palliative care by specialist   Goals of care, counseling/discussion   Acute metabolic encephalopathy In setting of acute infection. Urinary source. Improved back to baseline  Urinary tract infection Urine cultures significant for E. Coli. Pan-sensitive. -Continue ceftriaxone   Hypernatremia In setting of IV NS fluids. IV fluids discontinued  Diabetes mellitus, type 2 On glimepiride and Januvia as an outpatient. -Continue SSI -Discontinue glimepiride on discharge  Chronic systolic heart failure Euvolemic. Initially dehydrated. Will need to monitor daily weights closely to ensure she does not get over-diuresed in the future.  History of CVA On aspirin and plavix. Will discontinue plavix prior to discharge -Continue Aspirin and Plavix  CKD stage III Baseline of 1.27. Stable  Essential hypertension Holding home losartan-hydrochlorothiazide. Will likely continue losartan only as an outpatient.  Dementia Supportive care. Decreased oral intake. Plan for residential hospice on  discharge.   DVT prophylaxis: Lovenox Code Status:   Code Status: DNR Family Communication: Granddaughter at bedside Disposition Plan: Discharge to Hospice when bed available   Consultants:   None  Procedures:   None  Antimicrobials:  Ceftriaxone    Subjective: Tired today. No issues overnight.  Objective: Vitals:   05/11/18 2041 05/12/18 0351 05/12/18 0449 05/12/18 1325  BP: (!) 155/47 (!) 156/45  (!) 163/49  Pulse: 86 78  75  Resp: 16 19  18   Temp: 98.9 F (37.2 C) 98.1 F (36.7 C)  98.7 F (37.1 C)  TempSrc: Oral Oral  Oral  SpO2: 95% 93%  94%  Weight:   73 kg   Height:        Intake/Output Summary (Last 24 hours) at 05/12/2018 1553 Last data filed at 05/12/2018 0642 Gross per 24 hour  Intake -  Output 1000 ml  Net -1000 ml   Filed Weights   05/10/18 1247 05/11/18 0648 05/12/18 0449  Weight: 70.3 kg 73.1 kg 73 kg    Examination:  General exam: Appears calm and comfortable Respiratory system: Clear to auscultation. Respiratory effort normal. Cardiovascular system: S1 & S2 heard, RRR. No murmurs, rubs, gallops or clicks. Gastrointestinal system: Abdomen is nondistended, soft and nontender. Normal bowel sounds heard. Central nervous system: Sleeping Extremities: No edema. No calf tenderness Skin: No cyanosis. No rashes   Data Reviewed: I have personally reviewed following labs and imaging studies  CBC: Recent Labs  Lab 05/10/18 1300 05/11/18 0927  WBC 8.9 8.4  HGB 11.2* 12.9  HCT 33.5* 37.6  MCV 91.5 90.6  PLT 510* 616*   Basic Metabolic Panel: Recent Labs  Lab 05/10/18 1300 05/11/18 0927  NA 141 147*  K 3.7  3.5  CL 102 108  CO2 28 28  GLUCOSE 384* 249*  BUN 41* 27*  CREATININE 1.46* 1.09*  CALCIUM 8.3* 9.1  MG  --  1.9   GFR: Estimated Creatinine Clearance: 30.8 mL/min (A) (by C-G formula based on SCr of 1.09 mg/dL (H)). Liver Function Tests: Recent Labs  Lab 05/10/18 1300 05/11/18 0927  AST 16 19  ALT 14 17  ALKPHOS  57 67  BILITOT 0.4 0.7  PROT 6.1* 6.7  ALBUMIN 2.9* 3.2*   No results for input(s): LIPASE, AMYLASE in the last 168 hours. No results for input(s): AMMONIA in the last 168 hours. Coagulation Profile: No results for input(s): INR, PROTIME in the last 168 hours. Cardiac Enzymes: No results for input(s): CKTOTAL, CKMB, CKMBINDEX, TROPONINI in the last 168 hours. BNP (last 3 results) Recent Labs    09/02/17 1057 09/20/17 1332 10/07/17 1517  PROBNP 2,069* 2,802* 2,885*   HbA1C: No results for input(s): HGBA1C in the last 72 hours. CBG: Recent Labs  Lab 05/11/18 1651 05/11/18 2159 05/12/18 0523 05/12/18 0739 05/12/18 1157  GLUCAP 172* 253* 241* 253* 194*   Lipid Profile: No results for input(s): CHOL, HDL, LDLCALC, TRIG, CHOLHDL, LDLDIRECT in the last 72 hours. Thyroid Function Tests: No results for input(s): TSH, T4TOTAL, FREET4, T3FREE, THYROIDAB in the last 72 hours. Anemia Panel: No results for input(s): VITAMINB12, FOLATE, FERRITIN, TIBC, IRON, RETICCTPCT in the last 72 hours. Sepsis Labs: No results for input(s): PROCALCITON, LATICACIDVEN in the last 168 hours.  Recent Results (from the past 240 hour(s))  Urine Culture     Status: Abnormal   Collection Time: 05/10/18  2:25 PM  Result Value Ref Range Status   Specimen Description   Final    URINE, CLEAN CATCH Performed at [facility] [address] [place], [place] [address]    Special Requests   Final    NONE Performed at [facility] [place] [address] [place], [place] [address]    Culture >=100,000 COLONIES/mL ESCHERICHIA COLI (A)  Final   Report Status 05/12/2018 FINAL  Final   Organism ID, Bacteria ESCHERICHIA COLI (A)  Final      Susceptibility   Escherichia coli - MIC*    AMPICILLIN <=2 SENSITIVE Sensitive     CEFAZOLIN <=4 SENSITIVE Sensitive     CEFTRIAXONE <=1 SENSITIVE Sensitive     CIPROFLOXACIN <=0.25 SENSITIVE Sensitive     GENTAMICIN <=1 SENSITIVE  Sensitive     IMIPENEM <=0.25 SENSITIVE Sensitive     NITROFURANTOIN <=16 SENSITIVE Sensitive     TRIMETH/SULFA <=20 SENSITIVE Sensitive     AMPICILLIN/SULBACTAM <=2 SENSITIVE Sensitive     PIP/TAZO <=4 SENSITIVE Sensitive     Extended ESBL NEGATIVE Sensitive     * >=100,000 COLONIES/mL ESCHERICHIA COLI         Radiology Studies: No results found.      Scheduled Meds: . aspirin EC  81 mg Oral Daily  . atorvastatin  80 mg Oral q1800  . clopidogrel  75 mg Oral Daily  . heparin  5,000 Units Subcutaneous Q8H  . insulin aspart  0-15 Units Subcutaneous TID WC   Continuous Infusions: . cefTRIAXone (ROCEPHIN)  IV 1 g (05/12/18 1353)     LOS: 1 day     Cordelia Poche, MD [facility] 05/12/2018, 3:53 PM Pager: [phone]  If 7PM-7AM, please contact night-coverage [email or web address] 05/12/2018, 3:53 PM

## 2018-05-12 NOTE — Progress Notes (Signed)
Patient qualifies for residential hospice per Dr. Rowe Pavy.  CSW made [facility] referral to hospice liaison, Venia Carbon.  CSW will continue to follow for discharge coordination.   Pricilla Holm, MSW, [place] Social Work [phone]

## 2018-05-13 DIAGNOSIS — I5022 Chronic systolic (congestive) heart failure: Secondary | ICD-10-CM | POA: Diagnosis not present

## 2018-05-13 DIAGNOSIS — E1122 Type 2 diabetes mellitus with diabetic chronic kidney disease: Secondary | ICD-10-CM | POA: Diagnosis not present

## 2018-05-13 DIAGNOSIS — G9341 Metabolic encephalopathy: Secondary | ICD-10-CM | POA: Diagnosis not present

## 2018-05-13 DIAGNOSIS — N183 Chronic kidney disease, stage 3 (moderate): Secondary | ICD-10-CM | POA: Diagnosis not present

## 2018-05-13 LAB — GLUCOSE, CAPILLARY
GLUCOSE-CAPILLARY: 258 mg/dL — AB (ref 70–99)
GLUCOSE-CAPILLARY: 190 mg/dL — AB (ref 70–99)
Glucose-Capillary: 269 mg/dL — ABNORMAL HIGH (ref 70–99)
Glucose-Capillary: 270 mg/dL — ABNORMAL HIGH (ref 70–99)
Glucose-Capillary: 287 mg/dL — ABNORMAL HIGH (ref 70–99)

## 2018-05-13 MED ORDER — HYDROCORTISONE 1 % EX CREA
TOPICAL_CREAM | Freq: Two times a day (BID) | CUTANEOUS | Status: DC
  Filled 2018-05-13: qty 28

## 2018-05-13 MED ORDER — KETOROLAC TROMETHAMINE 15 MG/ML IJ SOLN: 15.0000 mg | Freq: Four times a day (QID) | INTRAMUSCULAR | Status: DC | PRN

## 2018-05-13 MED ORDER — HYDROCORTISONE 1 % EX CREA
TOPICAL_CREAM | Freq: Two times a day (BID) | CUTANEOUS | Status: DC
  Administered 2018-05-13 – 2018-05-15 (×4): via TOPICAL
  Filled 2018-05-13: qty 28

## 2018-05-13 NOTE — Progress Notes (Signed)
CSW spoke with patient's granddaughter, Melody [phone]). Melody expressed disappointment in the patient being declined by [facility] and called to request specific details as to why the patient was denied.   CSW explained that criteria for declining the patient was unfortunately not provided. CSW shared that the family may pursue in home hospice services or hospice services at a facility.   The patient arrived at [facility] from [place], where the patient's granddaughter states the patient was left in soiled clothes and did not receive adequate care. The patient's family is reluctant to send the patient back to [facility].  CSW encouraged Melody to talk to her family about in home hospice or facility hospice options and empathized with her disappointment.   Stephanie Acre, [place] Social Worker [phone]

## 2018-05-13 NOTE — Progress Notes (Addendum)
CSW aware that [facility] does not have available beds as of this morning, will follow up with facility at a later time.  CSW continuing to follow for discharge needs.   2:00pm  CSW spoke with [place] at [facility]. They are still willing to accept the patient, but unfortunately do not have bed availability at this time. [facility] will reach out to CSW once a bed becomes available.  CSW continuing to follow for discharge needs.   3:30pm  CSW received a call from [facility], they state the patient is not appropriate for residential hospice.  Stephanie Acre, [place] Social Worker [phone]

## 2018-05-13 NOTE — Progress Notes (Signed)
PROGRESS NOTE    Christine [name]  [id number]:[id number] DOB: Apr 15, 1922 DOA: 05/10/2018 PCP: Lajean Manes, MD   Brief Narrative: Christine [name] is a 84 y.o. female with medical history significant of with history of systolic congestive heart failure ejection fraction 35%, dementia, diabetes mellitus type 2, essential hypertension, recent CVA, CKD stage III. She presented secondary to mental status change and found to be dehydrated in addition to having a UTI.   Assessment & Plan:   Active Problems:   UTI (urinary tract infection)   Hypertension, secondary   Type II diabetes mellitus (HCC)   Chronic systolic CHF (congestive heart failure) (HCC)   Hyperlipidemia   CKD (chronic kidney disease) stage 3, GFR 30-59 ml/min (HCC)   Cerebrovascular accident (CVA) due to thrombosis of right posterior cerebral artery ([place])   Acute metabolic encephalopathy   Altered mental status   Generalized weakness   Palliative care by specialist   Goals of care, counseling/discussion   Acute metabolic encephalopathy In setting of acute infection. Urinary source. Improved back to baseline  Urinary tract infection Urine cultures significant for E. Coli. Pan-sensitive. -Continue ceftriaxone   Hypernatremia In setting of IV NS fluids. IV fluids discontinued  Diabetes mellitus, type 2 On glimepiride and Januvia as an outpatient. -Continue SSI -Discontinue glimepiride on discharge  Chronic systolic heart failure Euvolemic. Initially dehydrated. Will need to monitor daily weights closely to ensure she does not get over-diuresed in the future.  History of CVA On aspirin and plavix. Will discontinue plavix prior to discharge -Continue Aspirin  CKD stage III Baseline of 1.27. Stable  Essential hypertension Holding home losartan-hydrochlorothiazide. Will likely continue losartan only as an outpatient.  Dementia Supportive care. Decreased oral intake. Plan for residential hospice on  discharge.   DVT prophylaxis: Lovenox Code Status:   Code Status: DNR Family Communication: Granddaughter at bedside Disposition Plan: Discharge to Hospice when bed available   Consultants:   None  Procedures:   None  Antimicrobials:  Ceftriaxone    Subjective: Was awake overnight. Had some right foot pain.  Objective: Vitals:   05/12/18 0449 05/12/18 1325 05/12/18 1950 05/13/18 0505  BP:  (!) 163/49 (!) 143/53 (!) 176/67  Pulse:  75 85 74  Resp:  18 18 20   Temp:  98.7 F (37.1 C) 98.7 F (37.1 C) 97.6 F (36.4 C)  TempSrc:  Oral    SpO2:  94% 94% 95%  Weight: 73 kg   77.4 kg  Height:       No intake or output data in the 24 hours ending 05/13/18 1243 Filed Weights   05/11/18 0648 05/12/18 0449 05/13/18 0505  Weight: 73.1 kg 73 kg 77.4 kg    Examination:  General exam: Appears calm and comfortable   Data Reviewed: I have personally reviewed following labs and imaging studies  CBC: Recent Labs  Lab 05/10/18 1300 05/11/18 0927  WBC 8.9 8.4  HGB 11.2* 12.9  HCT 33.5* 37.6  MCV 91.5 90.6  PLT 510* 505*   Basic Metabolic Panel: Recent Labs  Lab 05/10/18 1300 05/11/18 0927  NA 141 147*  K 3.7 3.5  CL 102 108  CO2 28 28  GLUCOSE 384* 249*  BUN 41* 27*  CREATININE 1.46* 1.09*  CALCIUM 8.3* 9.1  MG  --  1.9   GFR: Estimated Creatinine Clearance: 31.8 mL/min (A) (by C-G formula based on SCr of 1.09 mg/dL (H)). Liver Function Tests: Recent Labs  Lab 05/10/18 1300 05/11/18 0927  AST 16 19  ALT 14 17  ALKPHOS 57 67  BILITOT 0.4 0.7  PROT 6.1* 6.7  ALBUMIN 2.9* 3.2*   No results for input(s): LIPASE, AMYLASE in the last 168 hours. No results for input(s): AMMONIA in the last 168 hours. Coagulation Profile: No results for input(s): INR, PROTIME in the last 168 hours. Cardiac Enzymes: No results for input(s): CKTOTAL, CKMB, CKMBINDEX, TROPONINI in the last 168 hours. BNP (last 3 results) Recent Labs    09/02/17 1057 09/20/17 1332  10/07/17 1517  PROBNP 2,069* 2,802* 2,885*   HbA1C: No results for input(s): HGBA1C in the last 72 hours. CBG: Recent Labs  Lab 05/12/18 2107 05/13/18 0011 05/13/18 0502 05/13/18 0852 05/13/18 1117  GLUCAP 269* 270* 269* 258* 190*   Lipid Profile: No results for input(s): CHOL, HDL, LDLCALC, TRIG, CHOLHDL, LDLDIRECT in the last 72 hours. Thyroid Function Tests: No results for input(s): TSH, T4TOTAL, FREET4, T3FREE, THYROIDAB in the last 72 hours. Anemia Panel: No results for input(s): VITAMINB12, FOLATE, FERRITIN, TIBC, IRON, RETICCTPCT in the last 72 hours. Sepsis Labs: No results for input(s): PROCALCITON, LATICACIDVEN in the last 168 hours.  Recent Results (from the past 240 hour(s))  Urine Culture     Status: Abnormal   Collection Time: 05/10/18  2:25 PM  Result Value Ref Range Status   Specimen Description   Final    URINE, CLEAN CATCH Performed at [facility] [address], [place], [place] [address]    Special Requests   Final    NONE Performed at [facility], [place] [address], [place], [place] [address]    Culture >=100,000 COLONIES/mL ESCHERICHIA COLI (A)  Final   Report Status 05/12/2018 FINAL  Final   Organism ID, Bacteria ESCHERICHIA COLI (A)  Final      Susceptibility   Escherichia coli - MIC*    AMPICILLIN <=2 SENSITIVE Sensitive     CEFAZOLIN <=4 SENSITIVE Sensitive     CEFTRIAXONE <=1 SENSITIVE Sensitive     CIPROFLOXACIN <=0.25 SENSITIVE Sensitive     GENTAMICIN <=1 SENSITIVE Sensitive     IMIPENEM <=0.25 SENSITIVE Sensitive     NITROFURANTOIN <=16 SENSITIVE Sensitive     TRIMETH/SULFA <=20 SENSITIVE Sensitive     AMPICILLIN/SULBACTAM <=2 SENSITIVE Sensitive     PIP/TAZO <=4 SENSITIVE Sensitive     Extended ESBL NEGATIVE Sensitive     * >=100,000 COLONIES/mL ESCHERICHIA COLI         Radiology Studies: No results found.      Scheduled Meds: . aspirin EC  81 mg Oral Daily  . atorvastatin  80 mg  Oral q1800  . heparin  5,000 Units Subcutaneous Q8H  . insulin aspart  0-15 Units Subcutaneous TID WC  . ketorolac  15 mg Intravenous Once   Continuous Infusions: . cefTRIAXone (ROCEPHIN)  IV 1 g (05/12/18 1353)     LOS: 1 day     Cordelia Poche, MD [facility] 05/13/2018, 12:43 PM Pager: [phone]  If 7PM-7AM, please contact night-coverage [email or web address] 05/13/2018, 12:43 PM

## 2018-05-13 NOTE — Progress Notes (Signed)
  Speech Language Pathology Treatment: Dysphagia  Patient Details Name: Christine [name] MRN: [id number] DOB: 1921/10/24 Today's Date: 05/13/2018 Time: 1325-1340 SLP Time Calculation (min) (ACUTE ONLY): 15 min  Assessment / Plan / Recommendation Clinical Impression  SLP follow up with pt's granddaughter. She reports pt is much happier. She is eating well, tolerating chopped foods and nectar thick liquids without overt difficulty. Granddaughter expressed appreciation for Palliative Care referral, and they are waiting for an available bed for DC with hospice. Education completed. No further ST intervention recommended. ST signing off.    HPI HPI: 84 year old female admitted 05/10/18 with AMS.  PMH: sCHF, dementia, DM2, essential HTN, recent CVA, CKD3. Head CT = no acute abnormality, CXR = no active disease.       SLP Plan  DC ST - All goals met       Recommendations  Diet recommendations: Regular;Nectar-thick liquid Liquids provided via: Cup;Straw Medication Administration: Whole meds with puree Supervision: Patient able to self feed;Staff to assist with self feeding;Full supervision/cueing for compensatory strategies Compensations: Slow rate;Small sips/bites;Minimize environmental distractions Postural Changes and/or Swallow Maneuvers: Seated upright 90 degrees                Oral Care Recommendations: Oral care BID Follow up Recommendations: 24 hour supervision/assistance SLP Visit Diagnosis: Dysphagia, unspecified (R13.10) Plan: All goals met       GO              Celia [name] [facility], CCC-SLP Speech Language Pathologist [phone]   Shonna Chock 05/13/2018, 2:15 PM

## 2018-05-14 DIAGNOSIS — N183 Chronic kidney disease, stage 3 (moderate): Secondary | ICD-10-CM | POA: Diagnosis not present

## 2018-05-14 DIAGNOSIS — E1122 Type 2 diabetes mellitus with diabetic chronic kidney disease: Secondary | ICD-10-CM | POA: Diagnosis not present

## 2018-05-14 DIAGNOSIS — I5022 Chronic systolic (congestive) heart failure: Secondary | ICD-10-CM | POA: Diagnosis not present

## 2018-05-14 DIAGNOSIS — G9341 Metabolic encephalopathy: Secondary | ICD-10-CM | POA: Diagnosis not present

## 2018-05-14 LAB — GLUCOSE, CAPILLARY
GLUCOSE-CAPILLARY: 204 mg/dL — AB (ref 70–99)
GLUCOSE-CAPILLARY: 210 mg/dL — AB (ref 70–99)
GLUCOSE-CAPILLARY: 226 mg/dL — AB (ref 70–99)
GLUCOSE-CAPILLARY: 279 mg/dL — AB (ref 70–99)
Glucose-Capillary: 240 mg/dL — ABNORMAL HIGH (ref 70–99)
Glucose-Capillary: 176 mg/dL — ABNORMAL HIGH (ref 70–99)

## 2018-05-14 MED ORDER — LOSARTAN POTASSIUM 50 MG PO TABS
50.0000 mg | ORAL_TABLET | Freq: Every day | ORAL | Status: DC
  Administered 2018-05-14 – 2018-05-15 (×2): 50 mg via ORAL
  Filled 2018-05-14 (×2): qty 1

## 2018-05-14 NOTE — Care Management Note (Addendum)
Case Management Note  Patient Details  Name: CHAKIA COUNTS MRN: [id number] Date of Birth: [birth year]  Subjective/Objective:    UTI, CHF, CVA, Acute metabolic encephalopathy                Action/Plan: NCM spoke to grand-dtr, Melody and she is requesting [facility] for Home Hospice. States she and her sister will be in the home to help care for pt. Contacted [facility] rep, Lattie Haw with new referral.    4:37 Call received from [facility], [place]. She spoke to pt's son, Kelvin Cellar and plan is dc back to [facility] SNF with Hospice. [facility] is holding her bed. Son states to speak to him or dtr, Janett Billow. Will notify CSW. Updated attending.   Expected Discharge Date:               Expected Discharge Plan:  Home w Hospice Care  In-House Referral:  Clinical Social Work  Discharge planning Services  CM Consult  Post Acute Care Choice:  Hospice Choice offered to:  Adult Children  DME Arranged:  Other see comment DME Agency:  [place]:  RN [facility] Agency:  [facility]  Status of Service:  Completed, signed off  If discussed at [place] of Stay Meetings, dates discussed:    Additional Comments:  Erenest Rasher, RN 05/14/2018, 1:47 PM

## 2018-05-14 NOTE — Consult Note (Signed)
[facility]: Called Son Dalbert to offer support and assist with discharge planning. Pt was not approved for GIP level of care yesterday. No residential beds available. Son is undecided if they wish to bring her home with homecare hospice support or SNF at [facility]. His daughter is coming into town this afternoon to assist with planning and decision making. Direct number for liaison provided if any further questions arise and to let us know decision once made as DME will need to be ordered prior to discharge. Will continue to follow and assist with d/c planning. Thank you for the opportunity to work with this family. Call with questions. Doroteo Glassman, RN [facility] [phone].

## 2018-05-14 NOTE — Progress Notes (Signed)
[facility] Hospital Liaison: RN Visit 3:20pm  Notified by Christine [name], CMRN, of family request for Christine [name] services at home after discharge. Chart and patient information under review however not completed.  Writer spoke with several family members (granddaughter Christine [name] and Christine [name]) with the last conversation with the pt's son Christine [name] [facility] telephonically. Christine [name] (son) indicates he has not decided on Hospice services or which Hospice agency to use at this time. States the pt may go to [facility] SNF with hospice involved at that time but again has not decided on which agency. RN informed family that Hospice can provide services for pt while at the SNF level of care. Son verbalized understanding of the information provided. Son Christine [name]) indicated he would decide on Monday or Tuesday which Hospice services he wishes to use for services at that time.  Family is aware to contact the RNCM Christine [name]) with their request for [facility] involvement once decided. Family very grateful and appreciative for the contacts today.   RN has updated Christine [name], RNCM of family decision to await Monday or Tuesday for their final decision on hospice services. Referral center also updated accordingly as this referral will be on HOLD til further notice via son's decision.   Please call with hospice related questions.  Thank you.  Christine Mina, RN, BSN [facility] Liaison [place] are on AMION

## 2018-05-14 NOTE — Progress Notes (Signed)
PROGRESS NOTE    Christine [name]  BZJ:[id number] DOB: 07-13-1922 DOA: 05/10/2018 PCP: Lajean Manes, MD   Brief Narrative: Christine [name] is a 84 y.o. female with medical history significant of with history of systolic congestive heart failure ejection fraction 35%, dementia, diabetes mellitus type 2, essential hypertension, recent CVA, CKD stage III. She presented secondary to mental status change and found to be dehydrated in addition to having a UTI.   Assessment & Plan:   Active Problems:   UTI (urinary tract infection)   Hypertension, secondary   Type II diabetes mellitus (HCC)   Chronic systolic CHF (congestive heart failure) (HCC)   Hyperlipidemia   CKD (chronic kidney disease) stage 3, GFR 30-59 ml/min (HCC)   Cerebrovascular accident (CVA) due to thrombosis of right posterior cerebral artery ([place])   Acute metabolic encephalopathy   Altered mental status   Generalized weakness   Palliative care by specialist   Goals of care, counseling/discussion   Acute metabolic encephalopathy In setting of acute infection. Urinary source. Improved back to baseline  Urinary tract infection Urine cultures significant for E. Coli. Pan-sensitive. -Continue ceftriaxone (last dose today)  Hypernatremia In setting of IV NS fluids. IV fluids discontinued  Diabetes mellitus, type 2 On glimepiride and Januvia as an outpatient. Uncontrolled here. Approaching comfort care only. Will not escalate treatment. -Continue SSI -Discontinue glimepiride on discharge  Chronic systolic heart failure Euvolemic. Initially dehydrated. Will need to monitor daily weights closely to ensure she does not get over-diuresed in the future.  History of CVA On aspirin and plavix. Will discontinue plavix prior to discharge -Continue Aspirin  CKD stage III Baseline of 1.27. Stable  Essential hypertension Holding home losartan-hydrochlorothiazide. -Restart losartan -Discontinue  hydrochlorothiazide  Dementia Supportive care. Decreased oral intake. Plan for residential hospice on discharge.   DVT prophylaxis: Lovenox Code Status:   Code Status: DNR Family Communication: Granddaughter at bedside Disposition Plan: Discharge home with hospice when equipment set up   Consultants:   None  Procedures:   None  Antimicrobials:  Ceftriaxone    Subjective: No issues overnight. Eating well.  Objective: Vitals:   05/13/18 1307 05/13/18 2013 05/14/18 0500 05/14/18 0503  BP: (!) 133/53 (!) 155/62  (!) 157/70  Pulse: 95 84  83  Resp: (!) 23 (!) 22  (!) 21  Temp: 98.6 F (37 C) 98.3 F (36.8 C)    TempSrc: Oral     SpO2: 92% 95%  95%  Weight:   77.6 kg   Height:        Intake/Output Summary (Last 24 hours) at 05/14/2018 1122 Last data filed at 05/13/2018 1843 Gross per 24 hour  Intake -  Output 600 ml  Net -600 ml   Filed Weights   05/12/18 0449 05/13/18 0505 05/14/18 0500  Weight: 73 kg 77.4 kg 77.6 kg    Examination:  General: Well appearing, no distress   Data Reviewed: I have personally reviewed following labs and imaging studies  CBC: Recent Labs  Lab 05/10/18 1300 05/11/18 0927  WBC 8.9 8.4  HGB 11.2* 12.9  HCT 33.5* 37.6  MCV 91.5 90.6  PLT 510* 017*   Basic Metabolic Panel: Recent Labs  Lab 05/10/18 1300 05/11/18 0927  NA 141 147*  K 3.7 3.5  CL 102 108  CO2 28 28  GLUCOSE 384* 249*  BUN 41* 27*  CREATININE 1.46* 1.09*  CALCIUM 8.3* 9.1  MG  --  1.9   GFR: Estimated Creatinine Clearance: 31.8 mL/min (A) (by  C-G formula based on SCr of 1.09 mg/dL (H)). Liver Function Tests: Recent Labs  Lab 05/10/18 1300 05/11/18 0927  AST 16 19  ALT 14 17  ALKPHOS 57 67  BILITOT 0.4 0.7  PROT 6.1* 6.7  ALBUMIN 2.9* 3.2*   No results for input(s): LIPASE, AMYLASE in the last 168 hours. No results for input(s): AMMONIA in the last 168 hours. Coagulation Profile: No results for input(s): INR, PROTIME in the last 168  hours. Cardiac Enzymes: No results for input(s): CKTOTAL, CKMB, CKMBINDEX, TROPONINI in the last 168 hours. BNP (last 3 results) Recent Labs    09/02/17 1057 09/20/17 1332 10/07/17 1517  PROBNP 2,069* 2,802* 2,885*   HbA1C: No results for input(s): HGBA1C in the last 72 hours. CBG: Recent Labs  Lab 05/13/18 1117 05/13/18 1644 05/14/18 0007 05/14/18 0616 05/14/18 0817  GLUCAP 190* 287* 204* 210* 240*   Lipid Profile: No results for input(s): CHOL, HDL, LDLCALC, TRIG, CHOLHDL, LDLDIRECT in the last 72 hours. Thyroid Function Tests: No results for input(s): TSH, T4TOTAL, FREET4, T3FREE, THYROIDAB in the last 72 hours. Anemia Panel: No results for input(s): VITAMINB12, FOLATE, FERRITIN, TIBC, IRON, RETICCTPCT in the last 72 hours. Sepsis Labs: No results for input(s): PROCALCITON, LATICACIDVEN in the last 168 hours.  Recent Results (from the past 240 hour(s))  Urine Culture     Status: Abnormal   Collection Time: 05/10/18  2:25 PM  Result Value Ref Range Status   Specimen Description   Final    URINE, CLEAN CATCH Performed at [facility] [address] [place], [place] [address]    Special Requests   Final    NONE Performed at [facility], [place] [address] [place], [place] [address]    Culture >=100,000 COLONIES/mL ESCHERICHIA COLI (A)  Final   Report Status 05/12/2018 FINAL  Final   Organism ID, Bacteria ESCHERICHIA COLI (A)  Final      Susceptibility   Escherichia coli - MIC*    AMPICILLIN <=2 SENSITIVE Sensitive     CEFAZOLIN <=4 SENSITIVE Sensitive     CEFTRIAXONE <=1 SENSITIVE Sensitive     CIPROFLOXACIN <=0.25 SENSITIVE Sensitive     GENTAMICIN <=1 SENSITIVE Sensitive     IMIPENEM <=0.25 SENSITIVE Sensitive     NITROFURANTOIN <=16 SENSITIVE Sensitive     TRIMETH/SULFA <=20 SENSITIVE Sensitive     AMPICILLIN/SULBACTAM <=2 SENSITIVE Sensitive     PIP/TAZO <=4 SENSITIVE Sensitive     Extended ESBL NEGATIVE Sensitive      * >=100,000 COLONIES/mL ESCHERICHIA COLI         Radiology Studies: No results found.      Scheduled Meds: . aspirin EC  81 mg Oral Daily  . atorvastatin  80 mg Oral q1800  . heparin  5,000 Units Subcutaneous Q8H  . hydrocortisone cream   Topical BID  . insulin aspart  0-15 Units Subcutaneous TID WC  . ketorolac  15 mg Intravenous Once   Continuous Infusions: . cefTRIAXone (ROCEPHIN)  IV 1 g (05/13/18 1626)     LOS: 1 day     Cordelia Poche, MD [facility] 05/14/2018, 11:22 AM Pager: [phone]  If 7PM-7AM, please contact night-coverage [email or web address] 05/14/2018, 11:22 AM

## 2018-05-15 DIAGNOSIS — G9341 Metabolic encephalopathy: Secondary | ICD-10-CM | POA: Diagnosis not present

## 2018-05-15 DIAGNOSIS — I5022 Chronic systolic (congestive) heart failure: Secondary | ICD-10-CM | POA: Diagnosis not present

## 2018-05-15 DIAGNOSIS — E1122 Type 2 diabetes mellitus with diabetic chronic kidney disease: Secondary | ICD-10-CM | POA: Diagnosis not present

## 2018-05-15 DIAGNOSIS — N183 Chronic kidney disease, stage 3 (moderate): Secondary | ICD-10-CM | POA: Diagnosis not present

## 2018-05-15 LAB — GLUCOSE, CAPILLARY
GLUCOSE-CAPILLARY: 216 mg/dL — AB (ref 70–99)
Glucose-Capillary: 208 mg/dL — ABNORMAL HIGH (ref 70–99)
Glucose-Capillary: 213 mg/dL — ABNORMAL HIGH (ref 70–99)
Glucose-Capillary: 153 mg/dL — ABNORMAL HIGH (ref 70–99)
Glucose-Capillary: 178 mg/dL — ABNORMAL HIGH (ref 70–99)

## 2018-05-15 MED ORDER — AMLODIPINE BESYLATE 5 MG PO TABS
5.0000 mg | ORAL_TABLET | Freq: Every day | ORAL | Status: DC
  Administered 2018-05-15 – 2018-05-16 (×3): 5 mg via ORAL
  Filled 2018-05-15 (×3): qty 1

## 2018-05-15 MED ORDER — LOSARTAN POTASSIUM 50 MG PO TABS
50.0000 mg | ORAL_TABLET | Freq: Every day | ORAL | Status: DC
  Administered 2018-05-16: 50 mg via ORAL
  Filled 2018-05-15: qty 1

## 2018-05-15 MED ORDER — LOSARTAN POTASSIUM 50 MG PO TABS: 100.0000 mg | ORAL_TABLET | Freq: Every day | ORAL | Status: DC

## 2018-05-15 NOTE — Progress Notes (Signed)
PROGRESS NOTE    Christine [name]  WVP:[id number] DOB: 05/08/1922 DOA: 05/10/2018 PCP: Lajean Manes, MD   Brief Narrative: Christine [name] is a 84 y.o. female with medical history significant of with history of systolic congestive heart failure ejection fraction 35%, dementia, diabetes mellitus type 2, essential hypertension, recent CVA, CKD stage III. She presented secondary to mental status change and found to be dehydrated in addition to having a UTI.   Assessment & Plan:   Active Problems:   UTI (urinary tract infection)   Hypertension, secondary   Type II diabetes mellitus (HCC)   Chronic systolic CHF (congestive heart failure) (HCC)   Hyperlipidemia   CKD (chronic kidney disease) stage 3, GFR 30-59 ml/min (HCC)   Cerebrovascular accident (CVA) due to thrombosis of right posterior cerebral artery ([place])   Acute metabolic encephalopathy   Altered mental status   Generalized weakness   Palliative care by specialist   Goals of care, counseling/discussion   Acute metabolic encephalopathy In setting of acute infection. Urinary source. Improved back to baseline.  Urinary tract infection Urine cultures significant for E. Coli. Pan-sensitive. Completed ceftriaxone.  Hypernatremia In setting of IV NS fluids. IV fluids discontinued  Diabetes mellitus, type 2 On glimepiride and Januvia as an outpatient. Uncontrolled here. Approaching comfort care only. Will not escalate treatment. -Continue SSI -Discontinue glimepiride on discharge  Chronic systolic heart failure Euvolemic. Initially dehydrated. Will need to monitor daily weights closely to ensure she does not get over-diuresed in the future.  History of CVA On aspirin and plavix. Will discontinue plavix prior to discharge -Continue Aspirin  CKD stage III Baseline of 1.27. Stable  Essential hypertension Poorly controlled -Continue losartan -Add amlodipine -Hydralazine prn  Dementia Supportive care. Decreased  oral intake. Plan for residential hospice on discharge.   DVT prophylaxis: Lovenox Code Status:   Code Status: DNR Family Communication: Granddaughter at bedside Disposition Plan: Discharge to SNF with hospice when bed available. Medically ready for discharge.   Consultants:   None  Procedures:   None  Antimicrobials:  Ceftriaxone    Subjective: No concerns overnight.  Objective: Vitals:   05/14/18 2023 05/15/18 0459 05/15/18 0500 05/15/18 1421  BP: (!) 176/69 (!) 177/63  (!) 154/132  Pulse: 79 71  81  Resp: 16 17    Temp: 98.4 F (36.9 C) 98.1 F (36.7 C)  97.6 F (36.4 C)  TempSrc: Oral Oral  Oral  SpO2: 96% 97%  98%  Weight:   76.2 kg   Height:        Intake/Output Summary (Last 24 hours) at 05/15/2018 1548 Last data filed at 05/14/2018 1700 Gross per 24 hour  Intake 120 ml  Output -  Net 120 ml   Filed Weights   05/13/18 0505 05/14/18 0500 05/15/18 0500  Weight: 77.4 kg 77.6 kg 76.2 kg    Examination:  General: Well appearing, no distress   Data Reviewed: I have personally reviewed following labs and imaging studies  CBC: Recent Labs  Lab 05/10/18 1300 05/11/18 0927  WBC 8.9 8.4  HGB 11.2* 12.9  HCT 33.5* 37.6  MCV 91.5 90.6  PLT 510* 546*   Basic Metabolic Panel: Recent Labs  Lab 05/10/18 1300 05/11/18 0927  NA 141 147*  K 3.7 3.5  CL 102 108  CO2 28 28  GLUCOSE 384* 249*  BUN 41* 27*  CREATININE 1.46* 1.09*  CALCIUM 8.3* 9.1  MG  --  1.9   GFR: Estimated Creatinine Clearance: 31.5 mL/min (A) (by  C-G formula based on SCr of 1.09 mg/dL (H)). Liver Function Tests: Recent Labs  Lab 05/10/18 1300 05/11/18 0927  AST 16 19  ALT 14 17  ALKPHOS 57 67  BILITOT 0.4 0.7  PROT 6.1* 6.7  ALBUMIN 2.9* 3.2*   No results for input(s): LIPASE, AMYLASE in the last 168 hours. No results for input(s): AMMONIA in the last 168 hours. Coagulation Profile: No results for input(s): INR, PROTIME in the last 168 hours. Cardiac  Enzymes: No results for input(s): CKTOTAL, CKMB, CKMBINDEX, TROPONINI in the last 168 hours. BNP (last 3 results) Recent Labs    09/02/17 1057 09/20/17 1332 10/07/17 1517  PROBNP 2,069* 2,802* 2,885*   HbA1C: No results for input(s): HGBA1C in the last 72 hours. CBG: Recent Labs  Lab 05/14/18 1647 05/14/18 2026 05/15/18 0504 05/15/18 0746 05/15/18 1139  GLUCAP 176* 226* 208* 216* 213*   Lipid Profile: No results for input(s): CHOL, HDL, LDLCALC, TRIG, CHOLHDL, LDLDIRECT in the last 72 hours. Thyroid Function Tests: No results for input(s): TSH, T4TOTAL, FREET4, T3FREE, THYROIDAB in the last 72 hours. Anemia Panel: No results for input(s): VITAMINB12, FOLATE, FERRITIN, TIBC, IRON, RETICCTPCT in the last 72 hours. Sepsis Labs: No results for input(s): PROCALCITON, LATICACIDVEN in the last 168 hours.  Recent Results (from the past 240 hour(s))  Urine Culture     Status: Abnormal   Collection Time: 05/10/18  2:25 PM  Result Value Ref Range Status   Specimen Description   Final    URINE, CLEAN CATCH Performed at [facility] [address] [place], [place] [address]    Special Requests   Final    NONE Performed at [facility], [place] [address], [place], [place] [address]    Culture >=100,000 COLONIES/mL ESCHERICHIA COLI (A)  Final   Report Status 05/12/2018 FINAL  Final   Organism ID, Bacteria ESCHERICHIA COLI (A)  Final      Susceptibility   Escherichia coli - MIC*    AMPICILLIN <=2 SENSITIVE Sensitive     CEFAZOLIN <=4 SENSITIVE Sensitive     CEFTRIAXONE <=1 SENSITIVE Sensitive     CIPROFLOXACIN <=0.25 SENSITIVE Sensitive     GENTAMICIN <=1 SENSITIVE Sensitive     IMIPENEM <=0.25 SENSITIVE Sensitive     NITROFURANTOIN <=16 SENSITIVE Sensitive     TRIMETH/SULFA <=20 SENSITIVE Sensitive     AMPICILLIN/SULBACTAM <=2 SENSITIVE Sensitive     PIP/TAZO <=4 SENSITIVE Sensitive     Extended ESBL NEGATIVE Sensitive     * >=100,000  COLONIES/mL ESCHERICHIA COLI         Radiology Studies: No results found.      Scheduled Meds: . aspirin EC  81 mg Oral Daily  . atorvastatin  80 mg Oral q1800  . heparin  5,000 Units Subcutaneous Q8H  . hydrocortisone cream   Topical BID  . insulin aspart  0-15 Units Subcutaneous TID WC  . losartan  50 mg Oral Daily   Continuous Infusions:    LOS: 1 day     Cordelia Poche, MD [facility] 05/15/2018, 3:48 PM Pager: [phone]  If 7PM-7AM, please contact night-coverage [email or web address] 05/15/2018, 3:48 PM

## 2018-05-15 NOTE — Progress Notes (Addendum)
Patient from [facility], plan is to discharge to [facility] with home hospice services. CSW attempted to reach [facility], admissions coordinator, left voicemail requesting call back. CSW called RN report number, states they were not aware of patient's discharge and do not believe there is a bed for the patient at this time- informed patient's physician and waiting for [facility] to follow up.  FL2 complete.   Update 4:00PM  Claiborne Billings states it is too late in the afternoon for patient to transport. Patient does have a bed and may come tomorrow.  Stephanie Acre, [place] Social Worker [phone]

## 2018-05-15 NOTE — NC FL2 (Addendum)
[place] LEVEL OF CARE SCREENING TOOL     IDENTIFICATION  Patient Name: MAGARET JUSTO Birthdate: 1922/03/26 Sex: female Admission Date (Current Location): 05/10/2018  [facility] and [place] Number:  Herbalist and Address:  [facility],  [address] [place], [place]      Provider Number: [id number]  Attending Physician Name and Address:  Mariel Aloe, MD  Relative Name and Phone Number:  Louanne Calvillo [phone]    Current Level of Care: Hospital Recommended Level of Care: [place] Prior Approval Number:    Date Approved/Denied: 05/15/18 PASRR Number: [id number]  Discharge Plan: SNF    Current Diagnoses: Patient Active Problem List   Diagnosis Date Noted  . Altered mental status   . Generalized weakness   . Palliative care by specialist   . Goals of care, counseling/discussion   . Acute metabolic encephalopathy 94/17/4081  . Facial droop 04/22/2018  . Cerebrovascular accident (CVA) due to thrombosis of right posterior cerebral artery ([place])   . TIA (transient ischemic attack) 04/21/2018  . Hyperlipidemia 12/06/2017  . CKD (chronic kidney disease) stage 3, GFR 30-59 ml/min (HCC) 12/06/2017  . Chronic systolic CHF (congestive heart failure) ([place]) 05/17/2017  . Dilated cardiomyopathy ([place]) 05/17/2017  . Femoral neck fracture, left, closed, initial encounter 03/28/2016  . UTI (urinary tract infection) 03/28/2016  . Hypertension, secondary 03/28/2016  . Hyponatremia 03/28/2016  . Leukocytosis 03/28/2016  . Thrombocytosis ([place]) 03/28/2016  . Type II diabetes mellitus ([place]) 03/28/2016  . Hip fracture ([place]) 03/28/2016    Orientation RESPIRATION BLADDER Height & Weight     Self  Normal Incontinent Weight: 167 lb 15.9 oz (76.2 kg) Height:  5\' 3"  (160 cm)  BEHAVIORAL SYMPTOMS/MOOD NEUROLOGICAL BOWEL NUTRITION STATUS      Incontinent Diet(Carb modified)  AMBULATORY STATUS COMMUNICATION OF NEEDS Skin    Extensive Assist Verbally                         Personal Care Assistance Level of Assistance  Bathing, Feeding, Dressing Bathing Assistance: Maximum assistance Feeding assistance: Maximum assistance Dressing Assistance: Maximum assistance Total Care Assistance: Maximum assistance   Functional Limitations Info             SPECIAL CARE FACTORS FREQUENCY                       Contractures Contractures Info: Not present    Additional Factors Info  Code Status, Allergies Code Status Info: DNR Allergies Info: No known allergies           Current Medications (05/15/2018):  This is the current hospital active medication list Current Facility-Administered Medications  Medication Dose Route Frequency Provider Last Rate Last Dose  . acetaminophen (TYLENOL) tablet 650 mg  650 mg Oral Q6H PRN Amin, Ankit Chirag, MD       Or  . acetaminophen (TYLENOL) suppository 650 mg  650 mg Rectal Q6H PRN Amin, Ankit Chirag, MD      . aspirin EC tablet 81 mg  81 mg Oral Daily Mariel Aloe, MD   81 mg at 05/15/18 1138  . atorvastatin (LIPITOR) tablet 80 mg  80 mg Oral q1800 Mariel Aloe, MD   80 mg at 05/14/18 1759  . bisacodyl (DULCOLAX) EC tablet 5 mg  5 mg Oral Daily PRN Amin, Ankit Chirag, MD      . heparin injection 5,000 Units  5,000 Units Subcutaneous  Q8H Amin, Ankit Chirag, MD   5,000 Units at 05/15/18 1547  . hydrALAZINE (APRESOLINE) injection 10 mg  10 mg Intravenous Q4H PRN Amin, Jeanella Flattery, MD   10 mg at 05/13/18 0522  . HYDROcodone-acetaminophen (NORCO/VICODIN) 5-325 MG per tablet 1-2 tablet  1-2 tablet Oral Q4H PRN Damita Lack, MD   1 tablet at 05/12/18 1832  . hydrocortisone cream 1 %   Topical BID Cordelia Poche A, MD      . insulin aspart (novoLOG) injection 0-15 Units  0-15 Units Subcutaneous TID WC Amin, Ankit Chirag, MD   5 Units at 05/15/18 1321  . ketorolac (TORADOL) 15 MG/ML injection 15 mg  15 mg Intravenous Q6H PRN Mariel Aloe, MD      .  losartan (COZAAR) tablet 50 mg  50 mg Oral Daily Mariel Aloe, MD   50 mg at 05/15/18 1138  . ondansetron (ZOFRAN) tablet 4 mg  4 mg Oral Q6H PRN Amin, Ankit Chirag, MD       Or  . ondansetron (ZOFRAN) injection 4 mg  4 mg Intravenous Q6H PRN Amin, Jeanella Flattery, MD      . RESOURCE THICKENUP CLEAR   Oral PRN Mariel Aloe, MD      . senna-docusate (Senokot-S) tablet 1 tablet  1 tablet Oral QHS PRN Amin, Jeanella Flattery, MD         Discharge Medications: Please see discharge summary for a list of discharge medications.  Relevant Imaging Results:  Relevant Lab Results:   Additional Information SSN: [id number]  Joellen [place], [place]

## 2018-05-16 DIAGNOSIS — I5022 Chronic systolic (congestive) heart failure: Secondary | ICD-10-CM | POA: Diagnosis not present

## 2018-05-16 DIAGNOSIS — G9341 Metabolic encephalopathy: Secondary | ICD-10-CM | POA: Diagnosis not present

## 2018-05-16 DIAGNOSIS — E785 Hyperlipidemia, unspecified: Secondary | ICD-10-CM | POA: Diagnosis not present

## 2018-05-16 DIAGNOSIS — N183 Chronic kidney disease, stage 3 (moderate): Secondary | ICD-10-CM | POA: Diagnosis not present

## 2018-05-16 LAB — GLUCOSE, CAPILLARY
GLUCOSE-CAPILLARY: 237 mg/dL — AB (ref 70–99)
Glucose-Capillary: 237 mg/dL — ABNORMAL HIGH (ref 70–99)

## 2018-05-16 MED ORDER — AMLODIPINE BESYLATE 5 MG PO TABS: 5.0000 mg | ORAL_TABLET | Freq: Every day | ORAL | Status: AC

## 2018-05-16 MED ORDER — INSULIN ASPART 100 UNIT/ML ~~LOC~~ SOLN: 0.0000 [IU] | Freq: Three times a day (TID) | SUBCUTANEOUS | 11 refills | Status: AC

## 2018-05-16 MED ORDER — LOSARTAN POTASSIUM 50 MG PO TABS: 50.0000 mg | ORAL_TABLET | Freq: Every day | ORAL | Status: AC

## 2018-05-16 MED ORDER — HYDROCORTISONE 1 % EX CREA: TOPICAL_CREAM | Freq: Two times a day (BID) | CUTANEOUS | 0 refills | Status: AC

## 2018-05-16 NOTE — Clinical Social Work Placement (Signed)
Patient received and accepted bed offer at [facility]. Facility aware of discharge and confirmed patient's bed offer. PTAR contacted, patient's family notified. Patient's RN can call report to [phone], packet complete. CSW signing off, no other needs identified at this time.  CLINICAL SOCIAL WORK PLACEMENT  NOTE  Date:  05/16/2018  Patient Details  Name: Christine [name] MRN: [id number] Date of Birth: [birth year]  Clinical Social Work is seeking post-discharge placement for this patient at the [place] level of care (*CSW will initial, date and re-position this form in  chart as items are completed):  Yes   Patient/family provided with [place] Work Department's list of facilities offering this level of care within the geographic area requested by the patient (or if unable, by the patient's family).  Yes   Patient/family informed of their freedom to choose among providers that offer the needed level of care, that participate in Medicare, Medicaid or managed care program needed by the patient, have an available bed and are willing to accept the patient.  Yes   Patient/family informed of [facility]'s ownership interest in [facility] and [facility], as well as of the fact that they are under no obligation to receive care at these facilities.  PASRR submitted to EDS on       PASRR number received on       Existing PASRR number confirmed on 05/15/18     FL2 transmitted to all facilities in geographic area requested by pt/family on 05/15/18     FL2 transmitted to all facilities within larger geographic area on       Patient informed that his/her managed care company has contracts with or will negotiate with certain facilities, including the following:        Yes   Patient/family informed of bed offers received.  Patient chooses bed at [facility]     Physician recommends and patient chooses bed at      Patient to be transferred to  [facility] on 05/16/18.  Patient to be transferred to facility by PTAR     Patient family notified on 05/16/18 of transfer.  Name of family member notified:  Dalbert Medley     PHYSICIAN       Additional Comment:    _______________________________________________ Burnis Medin, LCSW 05/16/2018, 11:34 AM

## 2018-05-16 NOTE — Progress Notes (Signed)
[facility])  Family still undecided about which hospice they would like to go home with.  Will await decision from CSW/RNCM.  [facility] will continue to follow and be available once decision has been made.  Thank you, Venia Carbon BSN, [facility] Liaison (listed in [facility]) [phone]

## 2018-05-16 NOTE — Progress Notes (Signed)
Called report to [employer] report to Therapist, sports. Awaiting PTAR

## 2018-05-16 NOTE — Discharge Summary (Signed)
Physician Discharge Summary  Christine [name] DGL:[id number] DOB: [birth year] DOA: 05/10/2018  PCP: Lajean Manes, MD  Admit date: 05/10/2018 Discharge date: 05/16/2018  Admitted From: SNF Disposition: SNF  Recommendations for Outpatient Follow-up:  1. Follow up with hospice 2. Sliding scale insulin per SNF policy 3. Please follow up on the following pending results: None  Home Health: SNF Equipment/Devices: None  Discharge Condition: Stable CODE STATUS: DNR Diet recommendation: Regular, nectar thick fluids  Brief/Interim Summary:  Admission HPI written by Damita Lack, MD   Chief Complaint: Change in mental status  HPI: Christine [name] is a 84 y.o. female with medical history significant of with history of systolic congestive heart failure ejection fraction 35%, dementia, diabetes mellitus type 2, essential hypertension, recent CVA, CKD stage III was brought to the hospital for evaluation of change in mental status.  Patient was here about 3 weeks ago and discharged on 9/14 after being treated for acute CVA.  Speech and swallow recommended dysphagia 2 diet at that time.  After going to her nursing facility she was doing well at first but over the course of last week or so she has become more bedbound and drowsy.  Granddaughter noted yesterday she appeared dehydrated and very drowsy.  She requested fluid but apparently the nursing home did not think she was dehydrated according to the daughter.  This morning patient was more somnolent therefore brought to the hospital for evaluation.  Denied any fevers, chills and other complaints.  In the ER patient was somnolent but was grossly moving all the extremities.  Clinically she appeared dehydrated.  CT of the head was negative for any acute pathology.  UA was suggestive of urinary tract infection therefore started on Rocephin and medical team was requested to admit the patient.   Hospital course:  Acute metabolic  encephalopathy In setting of acute infection. Urinary source. Improved back to baseline.  Urinary tract infection Urine cultures significant for E. Coli. Pan-sensitive. Completed ceftriaxone.  Hypernatremia In setting of IV NS fluids. IV fluids discontinued  Diabetes mellitus, type 2 On glimepiride and Januvia as an outpatient. Uncontrolled here. Approaching comfort care only. Will not escalate treatment. Discontinue glimepiride. Sliding scale per SNF protocol.  Chronic systolic heart failure Euvolemic. Will discontinue lasix on discharge  History of CVA On aspirin and plavix. Will discontinue plavix prior to discharge. Continued aspirin.  CKD stage III Baseline of 1.27. Stable  Essential hypertension Poorly controlled. Continued losartan and added amlodipine. Continue on discharge.  Dementia Supportive care. Decreased oral intake. SNF with hospice.  Discharge Diagnoses:  Active Problems:   UTI (urinary tract infection)   Hypertension, secondary   Type II diabetes mellitus (HCC)   Chronic systolic CHF (congestive heart failure) (HCC)   Hyperlipidemia   CKD (chronic kidney disease) stage 3, GFR 30-59 ml/min (HCC)   Cerebrovascular accident (CVA) due to thrombosis of right posterior cerebral artery ([place])   Acute metabolic encephalopathy   Altered mental status   Generalized weakness   Palliative care by specialist   Goals of care, counseling/discussion    Discharge Instructions  Discharge Instructions    Increase activity slowly   Complete by:  As directed      Allergies as of 05/16/2018   No Known Allergies     Medication List    STOP taking these medications   clopidogrel 75 MG tablet Commonly known as:  PLAVIX   furosemide 40 MG tablet Commonly known as:  LASIX   glimepiride 2  MG tablet Commonly known as:  AMARYL   losartan-hydrochlorothiazide 50-12.5 MG tablet Commonly known as:  HYZAAR   Melatonin 3 MG Tabs     TAKE these medications    amLODipine 5 MG tablet Commonly known as:  NORVASC Take 1 tablet (5 mg total) by mouth daily.   aspirin EC 81 MG tablet Take 81 mg by mouth daily.   atorvastatin 80 MG tablet Commonly known as:  LIPITOR Take 1 tablet (80 mg total) by mouth daily.   hydrocortisone cream 1 % Apply topically 2 (two) times daily.   insulin aspart 100 UNIT/ML injection Commonly known as:  novoLOG Inject 0-15 Units into the skin 3 (three) times daily with meals. CBG 70 - 120: 0 units CBG 121 - 150: 2 units CBG 151 - 200: 3 units CBG 201 - 250: 5 units CBG 251 - 300: 8 units CBG 301 - 350: 11 units CBG 351 - 400: 15 units   losartan 50 MG tablet Commonly known as:  COZAAR Take 1 tablet (50 mg total) by mouth daily.   metoprolol succinate 25 MG 24 hr tablet Commonly known as:  TOPROL-XL Take 1 tablet (25 mg total) by mouth daily.   pantoprazole 40 MG tablet Commonly known as:  PROTONIX Take 1 tablet (40 mg total) by mouth daily at 12 noon.   senna-docusate 8.6-50 MG tablet Commonly known as:  Senokot-S Take 1 tablet by mouth at bedtime.   sitaGLIPtin 50 MG tablet Commonly known as:  JANUVIA Take 50 mg by mouth daily.      Follow-up Information    [facility] Follow up.   Specialty:  Hospice and Palliative Medicine Why:  Home Hospice RN will call to arrange initial visit Contact information: [place] [address] [phone]          No Known Allergies  Consultations:  Palliative care   Procedures/Studies: Ct Head Wo Contrast  Result Date: 05/10/2018 CLINICAL DATA:  84 year old female with altered mental status, nonverbal. EXAM: CT HEAD WITHOUT CONTRAST TECHNIQUE: Contiguous axial images were obtained from the base of the skull through the vertex without intravenous contrast. COMPARISON:  Head CT, brain MRI and intracranial MRA 04/21/2018. FINDINGS: Brain: The small lacunar infarct at the ventral right thalamus seen on the MRI last month  remains occult by CT. Gray-white matter differentiation appears stable with no midline shift, ventriculomegaly, mass effect, evidence of mass lesion, intracranial hemorrhage or evidence of cortically based acute infarction. Vascular: Calcified atherosclerosis at the skull base. No suspicious intracranial vascular hyperdensity. Skull: No acute osseous abnormality identified. Sinuses/Orbits: Stable left maxillary sinus disease, well pneumatized otherwise. Other: Stable orbit and scalp soft tissues. IMPRESSION: 1. No acute intracranial abnormality identified, stable non contrast CT appearance of the brain. 2. Small right thalamic lacune seen on the 04/21/2018 MRI remains occult by CT. Electronically Signed   By: Genevie Ann M.D.   On: 05/10/2018 13:30   Ct Head Wo Contrast  Result Date: 04/21/2018 CLINICAL DATA:  Altered level of consciousness, slumped over in dining area, LEFT facial droop, some LEFT arm and leg drift, history CHF, dilated cardiomyopathy, hypertension, type II diabetes mellitus, stage III chronic kidney disease EXAM: CT HEAD WITHOUT CONTRAST TECHNIQUE: Contiguous axial images were obtained from the base of the skull through the vertex without intravenous contrast. Sagittal and coronal MPR images reconstructed from axial data set. COMPARISON:  None FINDINGS: Brain: Generalized atrophy. Normal ventricular morphology. No midline shift or mass effect. Small vessel chronic ischemic  changes of deep cerebral white matter. No intracranial hemorrhage, mass lesion, evidence of acute infarction, or extra-axial fluid collection. Vascular: Atherosclerotic calcification of internal carotid arteries at skull base Skull: Demineralized but intact Sinuses/Orbits: Scattered opacification of a few LEFT ethmoid air cells and LEFT maxillary sinus. Other: N/A IMPRESSION: Atrophy with small vessel chronic ischemic changes of deep cerebral white matter. No acute intracranial abnormalities. Electronically Signed   By: Lavonia Dana M.D.   On: 04/21/2018 20:32   Mr Brain Wo Contrast  Result Date: 04/22/2018 CLINICAL DATA:  84 y/o F; lethargy, left facial droop, arm and leg drift. EXAM: MRI HEAD WITHOUT CONTRAST MRA HEAD WITHOUT CONTRAST TECHNIQUE: Multiplanar, multiecho pulse sequences of the brain and surrounding structures were obtained without intravenous contrast. Angiographic images of the head were obtained using MRA technique without contrast. COMPARISON:  None. FINDINGS: MRI HEAD FINDINGS Brain: Subcentimeter focus of reduced diffusion within the right anterior thalamus extending into the right anterior cerebral peduncle (series 5 image [id number]) compatible with acute/early subacute infarction. No associated hemorrhage or mass effect. Several nonspecific T2 FLAIR hyperintensities in subcortical and periventricular white matter are compatible with mild chronic microvascular ischemic changes for age. Mild punctate foci of susceptibility hypointensity are present within the right frontal subcortical white matter and the left thalamus compatible hemosiderin deposition of chronic microhemorrhage. Volume loss of the brain. Vascular: Normal flow voids. Skull and upper cervical spine: Normal marrow signal. Sinuses/Orbits: Moderate mucosal thickening of the left anterior ethmoid air cells and the maxillary sinus. Trace opacification of left mastoid air cells. Bilateral intra-ocular lens replacement. Other: None. MRA HEAD FINDINGS Anterior circulation: No large vessel occlusion or aneurysm. Mild proximal right M1 stenosis and M2 superior division stenosis. Moderate left M2 inferior division and severe left M2 superior division proximal stenosis. Lumen irregularity of carotid siphons compatible with atherosclerotic disease with mild right and moderate left paraclinoid stenosis. Posterior circulation: No large vessel occlusion. Severe stenosis of the distal left vertebral artery just upstream to the vertebrobasilar junction. 2 mm outpouching  at the basilar tip may represent the infundibulum of diminutive vessel or a tiny aneurysm (series 1015, image 7). Multiple segments of mild-to-moderate stenosis in the bilateral PCA distributions. Anatomic variation: Right fetal PCA. Large right A1, large anterior communicating artery, small left A1, normal variant. IMPRESSION: 1. 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 No associated hemorrhage or mass effect. 2. Mild chronic microvascular ischemic changes and volume loss of the brain for age. 3. Left anterior ethmoid and left maxillary sinus disease. 4. Patent anterior and posterior intracranial circulation. No large vessel occlusion. 5. Basilar tip 2 mm outpouching may represent infundibular origin of diminutive vessel or aneurysm. 6. Intracranial atherosclerosis with multiple segments of stenosis in the anterior and posterior circulation. These results will be called to the ordering clinician or representative by the Radiologist Assistant, and communication documented in the PACS or zVision Dashboard. Electronically Signed   By: Kristine Garbe M.D.   On: 04/22/2018 00:21   Dg Chest Port 1 View  Result Date: 05/10/2018 CLINICAL DATA:  Weakness EXAM: PORTABLE CHEST 1 VIEW COMPARISON:  04/21/2018 FINDINGS: Mild cardiomegaly. Low lung volumes. No confluent airspace opacities, effusions or edema. No acute bony abnormality. IMPRESSION: Cardiomegaly.  Low lung volumes.  No active disease. Electronically Signed   By: Rolm Baptise M.D.   On: 05/10/2018 13:11   Dg Chest Portable 1 View  Result Date: 04/21/2018 CLINICAL DATA:  Left facial droop and altered  mental status. EXAM: PORTABLE CHEST 1 VIEW COMPARISON:  02/22/2017 FINDINGS: 1820 hours. Low lung volumes. Asymmetric elevation right hemidiaphragm. The lungs are clear without focal pneumonia, edema, pneumothorax or pleural effusion. Cardiopericardial silhouette is  at upper limits of normal for size. The visualized bony structures of the thorax are intact. Telemetry leads overlie the chest. IMPRESSION: No active disease. Electronically Signed   By: Misty Stanley M.D.   On: 04/21/2018 18:58   Mr Jodene Nam Head Wo Contrast  Result Date: 04/22/2018 CLINICAL DATA:  84 y/o F; lethargy, left facial droop, arm and leg drift. EXAM: MRI HEAD WITHOUT CONTRAST MRA HEAD WITHOUT CONTRAST TECHNIQUE: Multiplanar, multiecho pulse sequences of the brain and surrounding structures were obtained without intravenous contrast. Angiographic images of the head were obtained using MRA technique without contrast. COMPARISON:  None. FINDINGS: MRI HEAD FINDINGS Brain: Subcentimeter focus of reduced diffusion within the right anterior thalamus extending into the right anterior cerebral peduncle (series 5 image 49-51) compatible with acute/early subacute infarction. No associated hemorrhage or mass effect. Several nonspecific T2 FLAIR hyperintensities in subcortical and periventricular white matter are compatible with mild chronic microvascular ischemic changes for age. Mild punctate foci of susceptibility hypointensity are present within the right frontal subcortical white matter and the left thalamus compatible hemosiderin deposition of chronic microhemorrhage. Volume loss of the brain. Vascular: Normal flow voids. Skull and upper cervical spine: Normal marrow signal. Sinuses/Orbits: Moderate mucosal thickening of the left anterior ethmoid air cells and the maxillary sinus. Trace opacification of left mastoid air cells. Bilateral intra-ocular lens replacement. Other: None. MRA HEAD FINDINGS Anterior circulation: No large vessel occlusion or aneurysm. Mild proximal right M1 stenosis and M2 superior division stenosis. Moderate left M2 inferior division and severe left M2 superior division proximal stenosis. Lumen irregularity of carotid siphons compatible with atherosclerotic disease with mild right and  moderate left paraclinoid stenosis. Posterior circulation: No large vessel occlusion. Severe stenosis of the distal left vertebral artery just upstream to the vertebrobasilar junction. 2 mm outpouching at the basilar tip may represent the infundibulum of diminutive vessel or a tiny aneurysm (series 1015, image 7). Multiple segments of mild-to-moderate stenosis in the bilateral PCA distributions. Anatomic variation: Right fetal PCA. Large right A1, large anterior communicating artery, small left A1, normal variant. IMPRESSION: 1. 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 No associated hemorrhage or mass effect. 2. Mild chronic microvascular ischemic changes and volume loss of the brain for age. 3. Left anterior ethmoid and left maxillary sinus disease. 4. Patent anterior and posterior intracranial circulation. No large vessel occlusion. 5. Basilar tip 2 mm outpouching may represent infundibular origin of diminutive vessel or aneurysm. 6. Intracranial atherosclerosis with multiple segments of stenosis in the anterior and posterior circulation. These results will be called to the ordering clinician or representative by the Radiologist Assistant, and communication documented in the PACS or zVision Dashboard. Electronically Signed   By: Kristine Garbe M.D.   On: 04/22/2018 00:21     Subjective: No issues overnight.  Discharge Exam: Vitals:   05/15/18 2006 05/16/18 0451  BP: (!) 160/80 (!) 178/80  Pulse: 80 83  Resp: 18 18  Temp: 97.9 F (36.6 C) 98 F (36.7 C)  SpO2: 100% 100%   Vitals:   05/15/18 1835 05/15/18 1837 05/15/18 2006 05/16/18 0451  BP: (!) 177/69 (!) 152/68 (!) 160/80 (!) 178/80  Pulse: 86 86 80 83  Resp:   18 18  Temp:   97.9 F (36.6  C) 98 F (36.7 C)  TempSrc:   Oral Oral  SpO2:   100% 100%  Weight:    74.3 kg  Height:        General: Pt is not in acute distress    The  results of significant diagnostics from this hospitalization (including imaging, microbiology, ancillary and laboratory) are listed below for reference.     Microbiology: Recent Results (from the past 240 hour(s))  Urine Culture     Status: Abnormal   Collection Time: 05/10/18  2:25 PM  Result Value Ref Range Status   Specimen Description   Final    URINE, CLEAN CATCH Performed at [facility] [address], [place], [place] [address]    Special Requests   Final    NONE Performed at [facility], [place] [address] [place], [place] [address]    Culture >=100,000 COLONIES/mL ESCHERICHIA COLI (A)  Final   Report Status 05/12/2018 FINAL  Final   Organism ID, Bacteria ESCHERICHIA COLI (A)  Final      Susceptibility   Escherichia coli - MIC*    AMPICILLIN <=2 SENSITIVE Sensitive     CEFAZOLIN <=4 SENSITIVE Sensitive     CEFTRIAXONE <=1 SENSITIVE Sensitive     CIPROFLOXACIN <=0.25 SENSITIVE Sensitive     GENTAMICIN <=1 SENSITIVE Sensitive     IMIPENEM <=0.25 SENSITIVE Sensitive     NITROFURANTOIN <=16 SENSITIVE Sensitive     TRIMETH/SULFA <=20 SENSITIVE Sensitive     AMPICILLIN/SULBACTAM <=2 SENSITIVE Sensitive     PIP/TAZO <=4 SENSITIVE Sensitive     Extended ESBL NEGATIVE Sensitive     * >=100,000 COLONIES/mL ESCHERICHIA COLI     Labs: BNP (last 3 results) No results for input(s): BNP in the last 8760 hours. Basic Metabolic Panel: Recent Labs  Lab 05/10/18 1300 05/11/18 0927  NA 141 147*  K 3.7 3.5  CL 102 108  CO2 28 28  GLUCOSE 384* 249*  BUN 41* 27*  CREATININE 1.46* 1.09*  CALCIUM 8.3* 9.1  MG  --  1.9   Liver Function Tests: Recent Labs  Lab 05/10/18 1300 05/11/18 0927  AST 16 19  ALT 14 17  ALKPHOS 57 67  BILITOT 0.4 0.7  PROT 6.1* 6.7  ALBUMIN 2.9* 3.2*   No results for input(s): LIPASE, AMYLASE in the last 168 hours. No results for input(s): AMMONIA in the last 168 hours. CBC: Recent Labs  Lab 05/10/18 1300  05/11/18 0927  WBC 8.9 8.4  HGB 11.2* 12.9  HCT 33.5* 37.6  MCV 91.5 90.6  PLT 510* 552*   Cardiac Enzymes: No results for input(s): CKTOTAL, CKMB, CKMBINDEX, TROPONINI in the last 168 hours. BNP: Invalid input(s): POCBNP CBG: Recent Labs  Lab 05/15/18 0746 05/15/18 1139 05/15/18 1640 05/15/18 2009 05/16/18 0735  GLUCAP 216* 213* 153* 178* 237*   D-Dimer No results for input(s): DDIMER in the last 72 hours. Hgb A1c No results for input(s): HGBA1C in the last 72 hours. Lipid Profile No results for input(s): CHOL, HDL, LDLCALC, TRIG, CHOLHDL, LDLDIRECT in the last 72 hours. Thyroid function studies No results for input(s): TSH, T4TOTAL, T3FREE, THYROIDAB in the last 72 hours.  Invalid input(s): FREET3 Anemia work up No results for input(s): VITAMINB12, FOLATE, FERRITIN, TIBC, IRON, RETICCTPCT in the last 72 hours. Urinalysis    Component Value Date/Time   COLORURINE YELLOW 05/10/2018 1346   APPEARANCEUR HAZY (A) 05/10/2018 1346   LABSPEC 1.006 05/10/2018 1346   PHURINE 6.0 05/10/2018 1346  GLUCOSEU >=500 (A) 05/10/2018 1346   HGBUR NEGATIVE 05/10/2018 1346   BILIRUBINUR NEGATIVE 05/10/2018 1346   KETONESUR NEGATIVE 05/10/2018 1346   PROTEINUR NEGATIVE 05/10/2018 1346   UROBILINOGEN 0.2 02/25/2011 1252   NITRITE POSITIVE (A) 05/10/2018 1346   LEUKOCYTESUR LARGE (A) 05/10/2018 1346   Sepsis Labs Invalid input(s): PROCALCITONIN,  WBC,  LACTICIDVEN Microbiology Recent Results (from the past 240 hour(s))  Urine Culture     Status: Abnormal   Collection Time: 05/10/18  2:25 PM  Result Value Ref Range Status   Specimen Description   Final    URINE, CLEAN CATCH Performed at [facility], [place] [address] [place], [place] [address]    Special Requests   Final    NONE Performed at [facility], [place] [address], [place], [place] [address]    Culture >=100,000 COLONIES/mL ESCHERICHIA COLI (A)  Final   Report Status 05/12/2018  FINAL  Final   Organism ID, Bacteria ESCHERICHIA COLI (A)  Final      Susceptibility   Escherichia coli - MIC*    AMPICILLIN <=2 SENSITIVE Sensitive     CEFAZOLIN <=4 SENSITIVE Sensitive     CEFTRIAXONE <=1 SENSITIVE Sensitive     CIPROFLOXACIN <=0.25 SENSITIVE Sensitive     GENTAMICIN <=1 SENSITIVE Sensitive     IMIPENEM <=0.25 SENSITIVE Sensitive     NITROFURANTOIN <=16 SENSITIVE Sensitive     TRIMETH/SULFA <=20 SENSITIVE Sensitive     AMPICILLIN/SULBACTAM <=2 SENSITIVE Sensitive     PIP/TAZO <=4 SENSITIVE Sensitive     Extended ESBL NEGATIVE Sensitive     * >=100,000 COLONIES/mL ESCHERICHIA COLI    SIGNED:   Cordelia Poche, MD [facility] 05/16/2018, 11:22 AM

## 2018-05-23 ENCOUNTER — Ambulatory Visit: Payer: Medicare Other | Admitting: Cardiology

## 2018-05-26 ENCOUNTER — Ambulatory Visit: Payer: Medicare Other | Admitting: Adult Health

## 2018-06-20 ENCOUNTER — Ambulatory Visit: Payer: Medicare Other | Admitting: Internal Medicine

## 2018-06-28 ENCOUNTER — Encounter: Payer: Self-pay | Admitting: Internal Medicine

## 2018-09-28 IMAGING — CT CT HEAD W/O CM
4 series · 16 of 47 positions shown, 18 images · non-contrast
Comparison: None

CLINICAL DATA: Altered level of consciousness, slumped over in
dining area, LEFT facial droop, some LEFT arm and leg drift, history
CHF, dilated cardiomyopathy, hypertension, type II diabetes
mellitus, stage III chronic kidney disease

EXAM:
CT HEAD WITHOUT CONTRAST
TECHNIQUE: Contiguous axial images were obtained from the base of the skull
through the vertex without intravenous contrast. Sagittal and
coronal MPR images reconstructed from axial data set.

[Series 3: head without · axial · non-contrast · 0.43mm/px · z∈[-105,+15]mm · 7 of 33 slices shown, 9 images]
[im 5/33  brain]
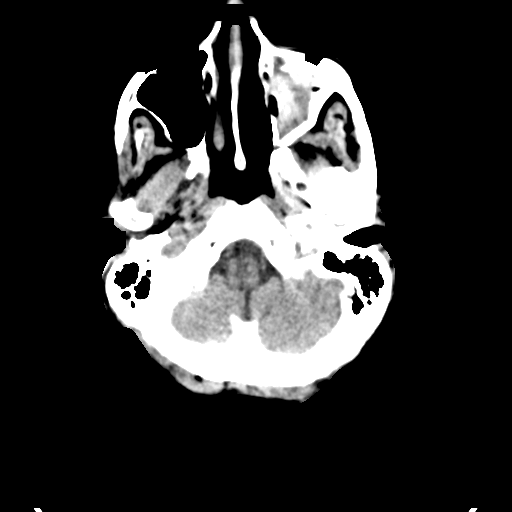
[im 5/33  bone]
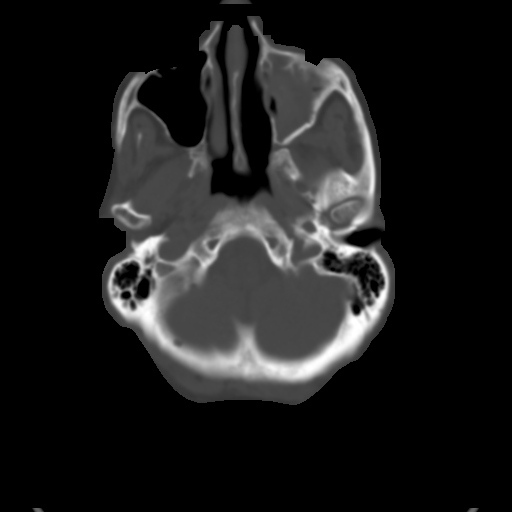
[im 9/33  brain]
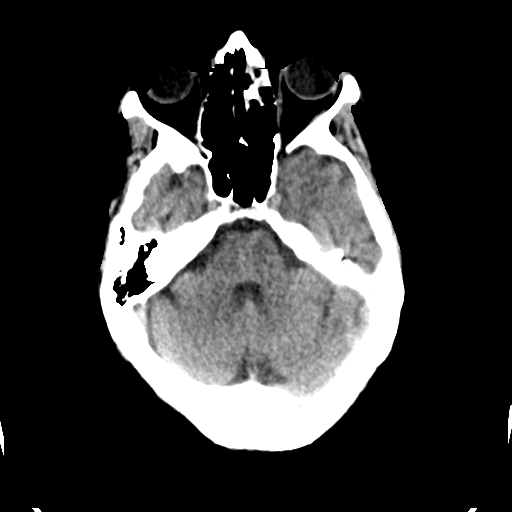
[im 13/33  brain]
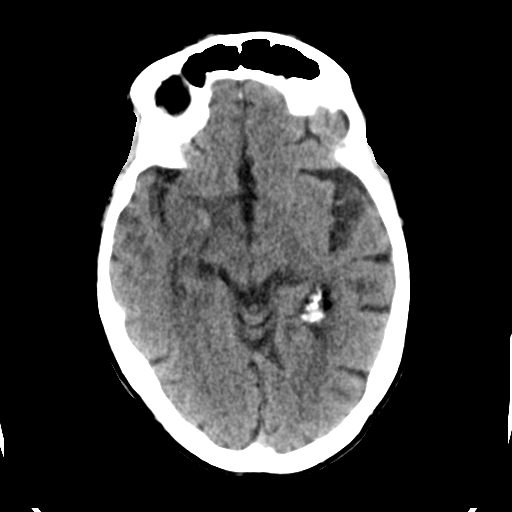
[im 17/33  brain]
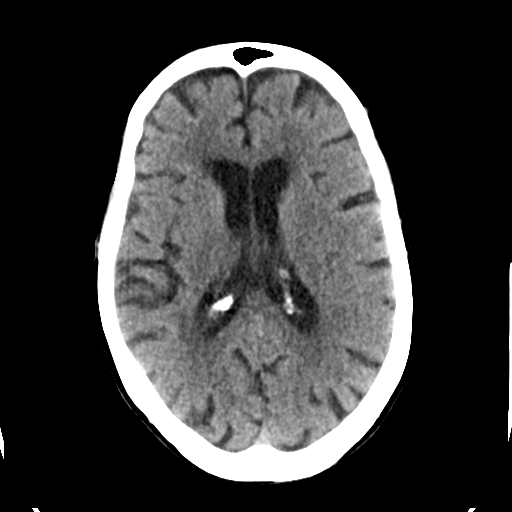
[im 21/33  brain]
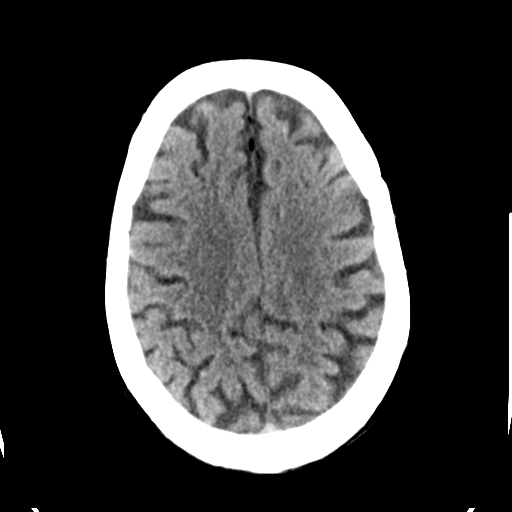
[im 21/33  bone]
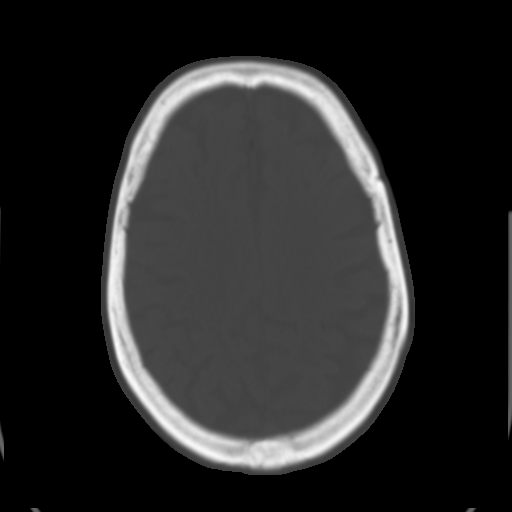
[im 25/33  brain]
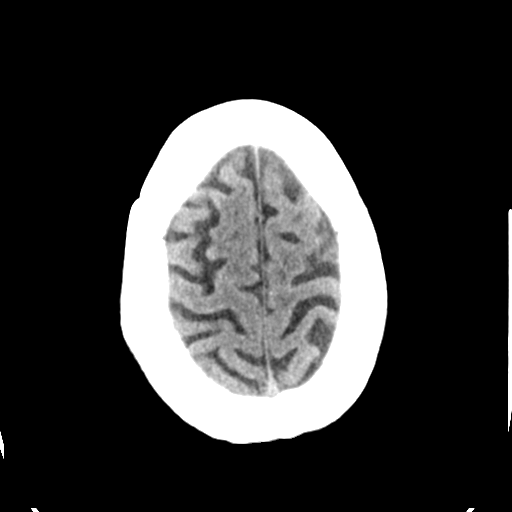
[im 29/33  brain]
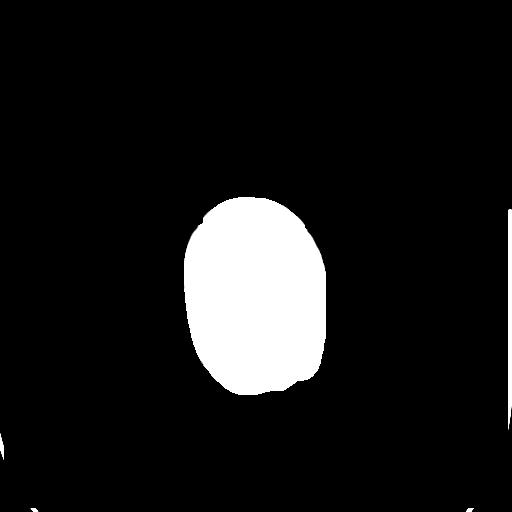

[Series 4: head bone · axial · 0.43mm/px · z∈[-109,-77]mm · 3 of 81 slices shown]
[im 9/81  bone]
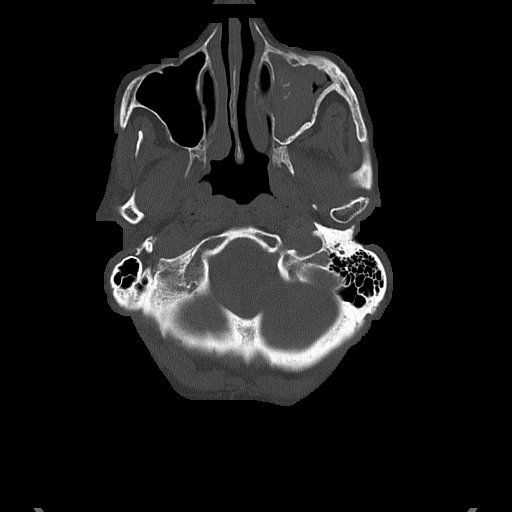
[im 17/81  bone]
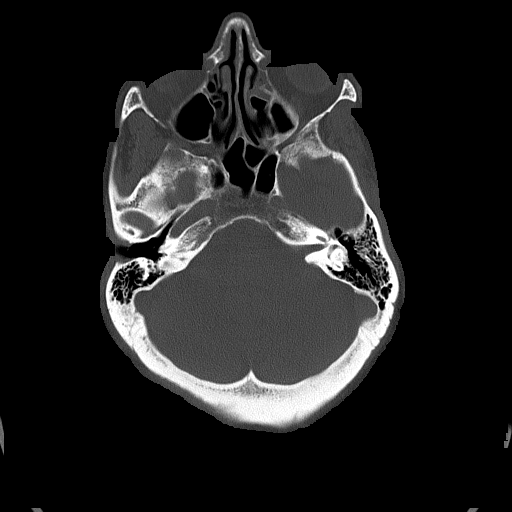
[im 25/81  bone]
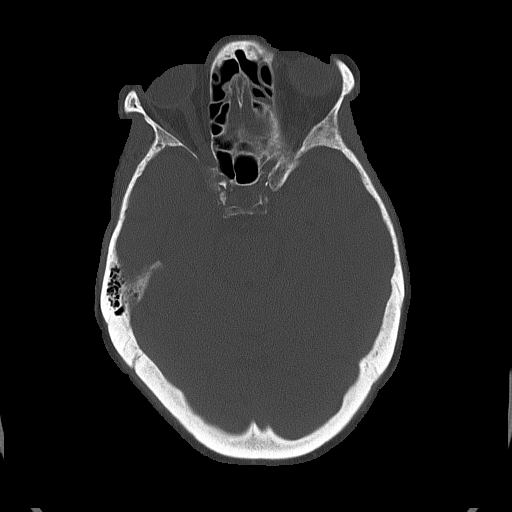

[Series 5: head without cor · coronal · non-contrast · 0.32mm/px · 3 of 72 slices shown]
[im 24/72  brain]
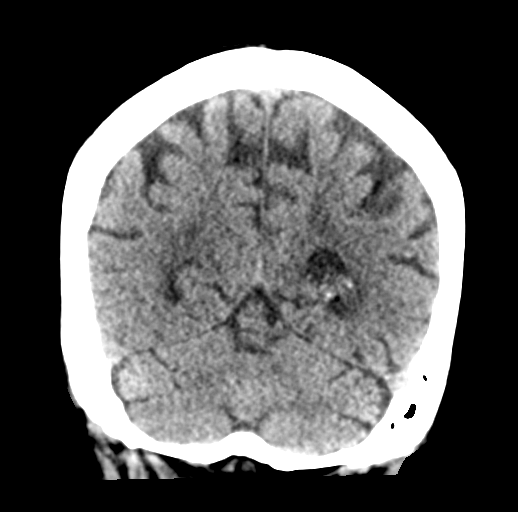
[im 32/72  brain]
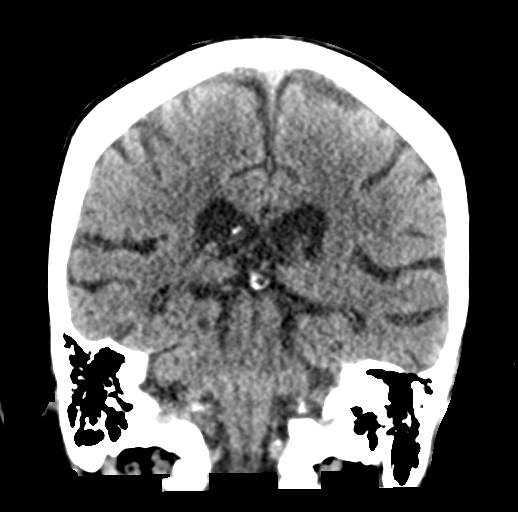
[im 40/72  brain]
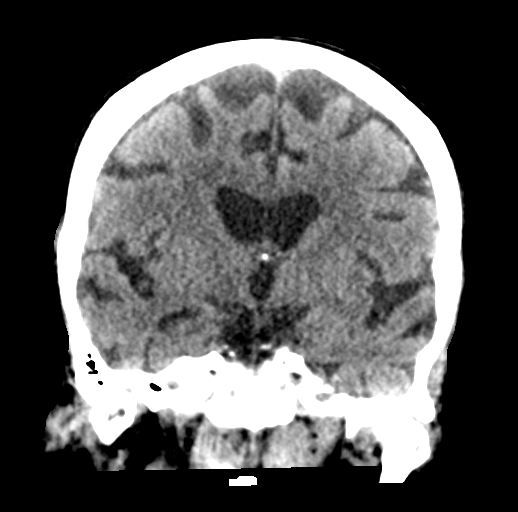

[Series 6: head without sag · sagittal · non-contrast · 0.32mm/px · 3 of 52 slices shown]
[im 18/52  brain]
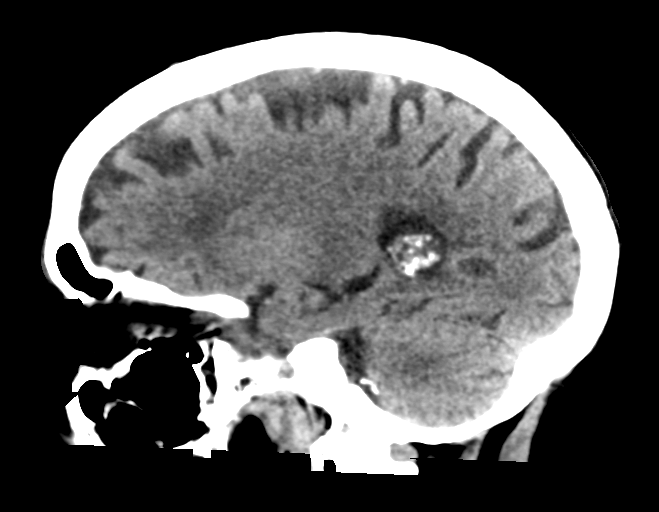
[im 26/52  brain]
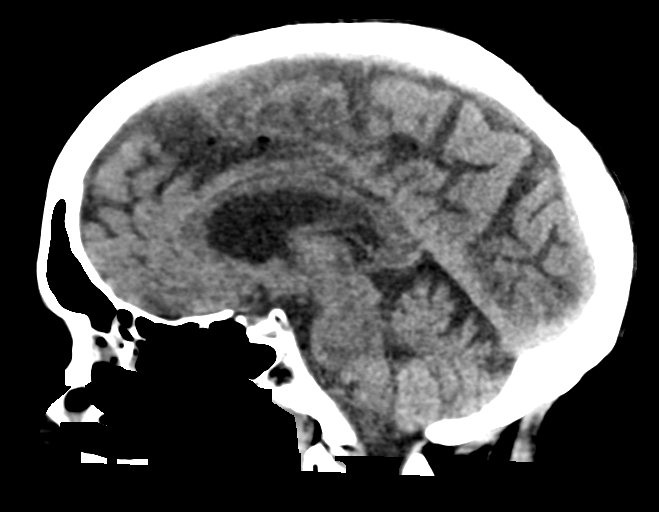
[im 35/52  brain]
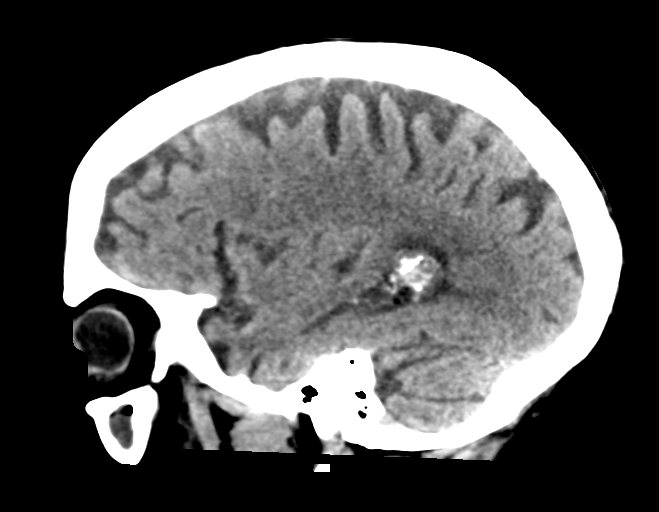

[16 of 47 positions shown; findings below may reference images not displayed]

FINDINGS: Brain: Generalized atrophy. Normal ventricular morphology. No
midline shift or mass effect. Small vessel chronic ischemic changes
of deep cerebral white matter. No intracranial hemorrhage, mass
lesion, evidence of acute infarction, or extra-axial fluid
collection.

Vascular: Atherosclerotic calcification of internal carotid arteries
at skull base

Skull: Demineralized but intact

Sinuses/Orbits: Scattered opacification of a few LEFT ethmoid air
cells and LEFT maxillary sinus.

Other: N/A
IMPRESSION: Atrophy with small vessel chronic ischemic changes of deep cerebral
white matter.

No acute intracranial abnormalities.

## 2018-10-09 DEATH — deceased
